# Patient Record
Sex: Male | Born: 1965 | Race: Black or African American | Hispanic: No | Smoking: Former smoker
Health system: Southern US, Community
[De-identification: ages and names within clinical notes are randomized; demographics above are authoritative.]

## PROBLEM LIST (undated history)

## (undated) DIAGNOSIS — N185 Chronic kidney disease, stage 5: Secondary | ICD-10-CM

## (undated) DIAGNOSIS — G509 Disorder of trigeminal nerve, unspecified: Secondary | ICD-10-CM

## (undated) DIAGNOSIS — G4710 Hypersomnia, unspecified: Secondary | ICD-10-CM

## (undated) DIAGNOSIS — R011 Cardiac murmur, unspecified: Secondary | ICD-10-CM

## (undated) DIAGNOSIS — I8390 Asymptomatic varicose veins of unspecified lower extremity: Secondary | ICD-10-CM

## (undated) DIAGNOSIS — I10 Essential (primary) hypertension: Secondary | ICD-10-CM

## (undated) DIAGNOSIS — J189 Pneumonia, unspecified organism: Secondary | ICD-10-CM

## (undated) DIAGNOSIS — J302 Other seasonal allergic rhinitis: Secondary | ICD-10-CM

## (undated) DIAGNOSIS — D869 Sarcoidosis, unspecified: Secondary | ICD-10-CM

## (undated) DIAGNOSIS — G4733 Obstructive sleep apnea (adult) (pediatric): Principal | ICD-10-CM

## (undated) DIAGNOSIS — E785 Hyperlipidemia, unspecified: Secondary | ICD-10-CM

## (undated) HISTORY — PX: KNEE ARTHROSCOPY: SHX127

## (undated) HISTORY — DX: Sarcoidosis, unspecified: D86.9

## (undated) HISTORY — DX: Essential (primary) hypertension: I10

## (undated) HISTORY — DX: Obstructive sleep apnea (adult) (pediatric): G47.33

## (undated) HISTORY — DX: Hyperlipidemia, unspecified: E78.5

## (undated) HISTORY — DX: Hypersomnia, unspecified: G47.10

## (undated) HISTORY — DX: Asymptomatic varicose veins of unspecified lower extremity: I83.90

## (undated) HISTORY — PX: LIVER BIOPSY: SHX301

## (undated) HISTORY — PX: INGUINAL HERNIA REPAIR: SUR1180

## (undated) HISTORY — DX: Disorder of trigeminal nerve, unspecified: G50.9

## (undated) HISTORY — PX: EYE MUSCLE SURGERY: SHX370

---

## 2012-04-19 ENCOUNTER — Ambulatory Visit: Payer: Self-pay | Admitting: Internal Medicine

## 2012-04-23 ENCOUNTER — Encounter: Payer: Self-pay | Admitting: Internal Medicine

## 2012-04-23 DIAGNOSIS — Z0001 Encounter for general adult medical examination with abnormal findings: Secondary | ICD-10-CM | POA: Insufficient documentation

## 2012-04-29 ENCOUNTER — Other Ambulatory Visit (INDEPENDENT_AMBULATORY_CARE_PROVIDER_SITE_OTHER): Payer: 59

## 2012-04-29 ENCOUNTER — Ambulatory Visit (INDEPENDENT_AMBULATORY_CARE_PROVIDER_SITE_OTHER): Payer: 59 | Admitting: Internal Medicine

## 2012-04-29 ENCOUNTER — Encounter: Payer: Self-pay | Admitting: Internal Medicine

## 2012-04-29 VITALS — BP 130/70 | HR 82 | Temp 97.8°F | Ht 77.0 in | Wt 305.0 lb

## 2012-04-29 DIAGNOSIS — G4710 Hypersomnia, unspecified: Secondary | ICD-10-CM

## 2012-04-29 DIAGNOSIS — Z0000 Encounter for general adult medical examination without abnormal findings: Secondary | ICD-10-CM

## 2012-04-29 DIAGNOSIS — D869 Sarcoidosis, unspecified: Secondary | ICD-10-CM

## 2012-04-29 DIAGNOSIS — T7840XA Allergy, unspecified, initial encounter: Secondary | ICD-10-CM | POA: Insufficient documentation

## 2012-04-29 DIAGNOSIS — G4733 Obstructive sleep apnea (adult) (pediatric): Secondary | ICD-10-CM | POA: Insufficient documentation

## 2012-04-29 DIAGNOSIS — I10 Essential (primary) hypertension: Secondary | ICD-10-CM

## 2012-04-29 DIAGNOSIS — I8390 Asymptomatic varicose veins of unspecified lower extremity: Secondary | ICD-10-CM

## 2012-04-29 DIAGNOSIS — E785 Hyperlipidemia, unspecified: Secondary | ICD-10-CM

## 2012-04-29 DIAGNOSIS — G509 Disorder of trigeminal nerve, unspecified: Secondary | ICD-10-CM | POA: Insufficient documentation

## 2012-04-29 HISTORY — DX: Hypersomnia, unspecified: G47.10

## 2012-04-29 HISTORY — DX: Obstructive sleep apnea (adult) (pediatric): G47.33

## 2012-04-29 HISTORY — DX: Asymptomatic varicose veins of unspecified lower extremity: I83.90

## 2012-04-29 LAB — HEPATIC FUNCTION PANEL
ALT: 32 U/L (ref 0–53)
AST: 23 U/L (ref 0–37)
Alkaline Phosphatase: 80 U/L (ref 39–117)
Bilirubin, Direct: 0 mg/dL (ref 0.0–0.3)
Total Protein: 6.8 g/dL (ref 6.0–8.3)

## 2012-04-29 LAB — LIPID PANEL: Total CHOL/HDL Ratio: 5

## 2012-04-29 LAB — URINALYSIS, ROUTINE W REFLEX MICROSCOPIC
Bilirubin Urine: NEGATIVE
Nitrite: NEGATIVE
Urine Glucose: NEGATIVE
Urobilinogen, UA: 0.2 (ref 0.0–1.0)

## 2012-04-29 LAB — CBC WITH DIFFERENTIAL/PLATELET
Basophils Absolute: 0 10*3/uL (ref 0.0–0.1)
Eosinophils Absolute: 0.2 10*3/uL (ref 0.0–0.7)
Lymphocytes Relative: 42 % (ref 12.0–46.0)
MCHC: 33.6 g/dL (ref 30.0–36.0)
MCV: 87.7 fl (ref 78.0–100.0)
Monocytes Absolute: 0.3 10*3/uL (ref 0.1–1.0)
Neutrophils Relative %: 48.5 % (ref 43.0–77.0)
RDW: 13.4 % (ref 11.5–14.6)

## 2012-04-29 LAB — BASIC METABOLIC PANEL
CO2: 30 mEq/L (ref 19–32)
Chloride: 106 mEq/L (ref 96–112)
Glucose, Bld: 96 mg/dL (ref 70–99)
Potassium: 4.9 mEq/L (ref 3.5–5.1)
Sodium: 143 mEq/L (ref 135–145)

## 2012-04-29 LAB — LDL CHOLESTEROL, DIRECT: Direct LDL: 177.1 mg/dL

## 2012-04-29 LAB — PSA: PSA: 1.08 ng/mL (ref 0.10–4.00)

## 2012-04-29 MED ORDER — ASPIRIN 81 MG PO TBEC: 81.0000 mg | DELAYED_RELEASE_TABLET | Freq: Every day | ORAL | Status: DC

## 2012-04-29 MED ORDER — ATORVASTATIN CALCIUM 20 MG PO TABS: 20.0000 mg | ORAL_TABLET | Freq: Every day | ORAL | Status: DC

## 2012-04-29 MED ORDER — OLMESARTAN-AMLODIPINE-HCTZ 20-5-12.5 MG PO TABS: 1.0000 | ORAL_TABLET | Freq: Every day | ORAL | Status: DC

## 2012-04-29 NOTE — Assessment & Plan Note (Signed)
With ? Leg cramp with the simvastatin; ok to change to generic lipitor 20 qd,  to f/u any worsening symptoms or concerns

## 2012-04-29 NOTE — Patient Instructions (Addendum)
Take all new medications as prescribed - the generic lipitor 20 mg per day Continue all other medications as before - the Blood Pressure medicine Please also start Aspirin 81 mg -1 per day - COATED only, to reduce risk of phlebitis, stroke and heart disease You will be contacted regarding the referral for: pulmonary for the sarcoid and possible sleep apnea Please go to LAB in the Basement for the blood and/or urine tests to be done today We will need to let you know if you need to re-start the potassium supplement You will be contacted by phone if any changes need to be made immediately.  Otherwise, you will receive a letter about your results with an explanation. Please return in 1 year for your yearly visit, or sooner if needed, with Lab testing done 3-5 days before

## 2012-04-29 NOTE — Progress Notes (Signed)
Subjective:    Patient ID: Devon [name], male    DOB: 25-May-1966, 46 y.o.   MRN: [id number]  HPI  Here for wellness and to establish as new pt - just moved from [place], works for [employer];  Overall doing ok;  Pt denies CP, worsening SOB, DOE, wheezing, orthopnea, PND, worsening LE edema, palpitations, dizziness or syncope.  Pt denies neurological change such as new Headache, facial or extremity weakness.  Pt denies polydipsia, polyuria, or low sugar symptoms. Pt states overall good compliance with treatment and medications, good tolerability, and trying to follow lower cholesterol diet.  Pt denies worsening depressive symptoms, suicidal ideation or panic. No fever, wt loss, night sweats, loss of appetite, or other constitutional symptoms.  Pt states good ability with ADL's, low fall risk, home safety reviewed and adequate, no significant changes in hearing or vision, and occasionally active with exercise.  Does have singificant snoring and stops breathing at night per wife.  Does have non refreshing sleep, could fall asleep anytime. Has mild LE varicsoties, no hx DVT or phlebitis.  Played 5 yrs Pro Football with [employer], no CNS or significant head trauma he is aware.  Does have occasional leg cramps on the simvastatin - asks for trial change to different statin. Has not taken asa regularly to date Past Medical History  Diagnosis Date  . Sarcoidosis   . Allergy   . Hyperlipidemia   . Hypertension   . 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 No pertinent past surgical history.  reports that he has quit smoking. He has never used smokeless tobacco. He reports that he drinks alcohol. He reports that he does not use illicit drugs. family history includes Cancer in his father and Hypothyroidism in his mother. No Known Allergies Current Outpatient Prescriptions on File Prior to Visit  Medication Sig Dispense Refill  . Olmesartan-Amlodipine-HCTZ (TRIBENZOR)  20-5-12.5 MG TABS Take by mouth daily.      . simvastatin (ZOCOR) 20 MG tablet Take 20 mg by mouth daily.       Review of Systems Review of Systems  Constitutional: Negative for diaphoresis, activity change, appetite change and unexpected weight change.  HENT: Negative for hearing loss, ear pain, facial swelling, mouth sores and neck stiffness.   Eyes: Negative for pain, redness and visual disturbance.  Respiratory: Negative for shortness of breath and wheezing.   Cardiovascular: Negative for chest pain and palpitations.  Gastrointestinal: Negative for diarrhea, blood in stool, abdominal distention and rectal pain.  Genitourinary: Negative for hematuria, flank pain and decreased urine volume.  Musculoskeletal: Negative for myalgias and joint swelling.  Skin: Negative for color change and wound.  Neurological: Negative for syncope and numbness.  Hematological: Negative for adenopathy.  Psychiatric/Behavioral: Negative for hallucinations, self-injury, decreased concentration and agitation.      Objective:   Physical Exam BP 130/70  Pulse 82  Temp 97.8 F (36.6 C) (Oral)  Ht 6\' 5"  (1.956 m)  Wt 305 lb (138.347 kg)  BMI 36.17 kg/m2  SpO2 95% Physical Exam  VS noted Constitutional: Pt appears well-developed and well-nourished.  HENT: Head: Normocephalic.  Right Ear: External ear normal.  Left Ear: External ear normal.  Eyes: Conjunctivae and EOM are normal. Pupils are equal, round, and reactive to light.  Neck: Normal range of motion. Neck supple.  Cardiovascular: Normal rate and regular rhythm.   Pulmonary/Chest: Effort normal and breath sounds normal.  Abd:  Soft, NT, non-distended, +  BS Neurological: Pt is alert. No cranial nerve deficit  Motor/sens/dtr intact.  Bilat knees with mild deg changes, no effusion Skin: Skin is warm. No erythema.  Psychiatric: Pt behavior is normal. Thought content normal. not nervous or depressed affect    Assessment & Plan:

## 2012-04-29 NOTE — Assessment & Plan Note (Signed)
stable overall by hx and exam, most recent data reviewed with pt, and pt to continue medical treatment as before BP Readings from Last 3 Encounters:  04/29/12 130/70  has been on K supplement in the past; will check with labs today

## 2012-04-29 NOTE — Assessment & Plan Note (Signed)
Overall doing well, age appropriate education and counseling updated, referrals for preventative services and immunizations addressed, dietary and smoking counseling addressed, most recent labs and ECG reviewed.  I have personally reviewed and have noted: 1) the patient's medical and social history 2) The pt's use of alcohol, tobacco, and illicit drugs 3) The patient's current medications and supplements 4) Functional ability including ADL's, fall risk, home safety risk, hearing and visual impairment 5) Diet and physical activities 6) Evidence for depression or mood disorder 7) The patient's height, weight, and BMI have been recorded in the chart I have made referrals, and provided counseling and education based on review of the above For labs today, o/w up to date.  To start Asa 81 mg per day - prophylactic

## 2012-04-29 NOTE — Assessment & Plan Note (Signed)
Will need f/u - refer pulm

## 2012-04-29 NOTE — Assessment & Plan Note (Signed)
Relatively high suspicion for osa - refer pulm

## 2012-05-03 ENCOUNTER — Telehealth: Payer: Self-pay | Admitting: *Deleted

## 2012-05-03 NOTE — Telephone Encounter (Signed)
Pt's spouse called regarding question about Tribenzor rx-left message for pt's spouse to callback office.

## 2012-05-04 NOTE — Telephone Encounter (Signed)
Pt is asking for alternative for Tribenzor rx due to cost.

## 2012-05-05 ENCOUNTER — Telehealth: Payer: Self-pay

## 2012-05-05 MED ORDER — AMLODIPINE BESYLATE 5 MG PO TABS: 5.0000 mg | ORAL_TABLET | Freq: Every day | ORAL | Status: DC

## 2012-05-05 MED ORDER — VALSARTAN-HYDROCHLOROTHIAZIDE 160-12.5 MG PO TABS: 1.0000 | ORAL_TABLET | Freq: Every day | ORAL | Status: DC

## 2012-05-05 NOTE — Telephone Encounter (Signed)
Patients wife informed of change and all information.

## 2012-05-05 NOTE — Telephone Encounter (Signed)
Ok to change tribenzor to diovan hct 160/12.5 AND amlod 5 qd - done per emr  (both are generic)  Please let pt know, if for some reason the diovan HCT is not covered as generic, we could consider change to generic avalide which may otherwise be covered

## 2012-05-05 NOTE — Telephone Encounter (Signed)
Called left message to call back 

## 2012-05-05 NOTE — Telephone Encounter (Signed)
Pt's spouse called stating that tribenzor is too expensive. Pt is requesting separate generic components of medication, please advise.

## 2012-06-01 ENCOUNTER — Ambulatory Visit (INDEPENDENT_AMBULATORY_CARE_PROVIDER_SITE_OTHER): Payer: 59 | Admitting: Pulmonary Disease

## 2012-06-01 ENCOUNTER — Encounter: Payer: Self-pay | Admitting: Pulmonary Disease

## 2012-06-01 VITALS — BP 118/78 | HR 71 | Temp 97.8°F | Ht 77.0 in | Wt 304.6 lb

## 2012-06-01 DIAGNOSIS — G4710 Hypersomnia, unspecified: Secondary | ICD-10-CM

## 2012-06-01 NOTE — Patient Instructions (Signed)
Will arrange for sleep study Will call to arrange follow up after sleep study reviewed 

## 2012-06-01 NOTE — Progress Notes (Signed)
Chief Complaint  Patient presents with  . sleep Consult    Per spouse he stops breathing at night, snores and feels tired during the day    History of Present Illness: Devon [name] is a 46 y.o. male for evaluation of sleep apnea.  He has been told by his wife that he snores, and stops breathing while asleep.  This has been getting worse.  She now has to sleep in separate room.  He feels sluggish and tired in the morning.  He denies headaches.  He goes to bed at 11 pm.  He falls asleep quickly.  He wakes up several times to use the bathroom.  He gets out of bed at 5 am.  He is not using anything to help him sleep or stay awake.  He does occasional talk in his sleep.  He will take a nap during his lunch break when he can.  He would do this more often if he had the time.  Epworth score is 14 out of 24.  The patient denies sleep walking, bruxism, or nightmares.  There is no history of restless legs.  The patient denies sleep hallucinations, sleep paralysis, or cataplexy.   Past Medical History  Diagnosis Date  . Sarcoidosis   . Allergy   . Hyperlipidemia   . Hypertension   . 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 Hypersomnolence 04/29/2012  . 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 Hernia repair   . Corrective left eye surgery   . Liver biopsy   . Lung biopsy   . Renal biopsy     Current Outpatient Prescriptions on File Prior to Visit  Medication Sig Dispense Refill  . amLODipine (NORVASC) 5 MG tablet Take 1 tablet (5 mg total) by mouth daily.  90 tablet  3  . aspirin 81 MG EC tablet Take 1 tablet (81 mg total) by mouth daily. Swallow whole.  30 tablet  12  . atorvastatin (LIPITOR) 20 MG tablet Take 1 tablet (20 mg total) by mouth daily.  90 tablet  3  . valsartan-hydrochlorothiazide (DIOVAN HCT) 160-12.5 MG per tablet Take 1 tablet by mouth daily.  90 tablet  3    No Known Allergies  Family History    Problem Relation Age of Onset  . Hypothyroidism Mother   . Cancer Father     History  Substance Use Topics  . Smoking status: Former Smoker -- 0.5 packs/day for 2 years    Types: Cigarettes    Quit date: 11/18/2007  . Smokeless tobacco: Never Used  . Alcohol Use: Yes     occasional social    Review of Systems  Constitutional: Negative for fever, appetite change and unexpected weight change.  HENT: Negative for ear pain, congestion, sore throat, sneezing, trouble swallowing and dental problem.   Respiratory: Negative for cough and shortness of breath.   Cardiovascular: Negative for chest pain, palpitations and leg swelling.  Gastrointestinal: Negative for abdominal pain.  Musculoskeletal: Negative for joint swelling.  Skin: Negative for rash.  Neurological: Negative for headaches.  Psychiatric/Behavioral: Negative for dysphoric mood. The patient is not nervous/anxious.    Physical Exam: Filed Vitals:   06/01/12 1143 06/01/12 1144  BP:  118/78  Pulse:  71  Temp: 97.8 F (36.6 C)   TempSrc: Oral   Height: 6\' 5"  (1.956 m)   Weight: 304 lb 9.6 oz (  138.166 kg)   SpO2:  95%  ,  Current Encounter SPO2  06/01/12 1144 95%  04/29/12 0945 95%    Wt Readings from Last 3 Encounters:  06/01/12 304 lb 9.6 oz (138.166 kg)  04/29/12 305 lb (138.347 kg)    Body mass index is 36.12 kg/(m^2).   General - No distress ENT - TM clear, no sinus tenderness, no oral exudate, no LAN, no thyromegaly, 3+ tonsils, scalloped tongue border Cardiac - s1s2 regular, no murmur, pulses symmetric, no edema Chest - normal respiratory excursion, good air entry, no wheeze/rales/dullness Back - no focal tenderness Abd - soft, non-tender, no organomegaly, + bowel sounds Ext - normal motor strength Neuro - Cranial nerves are normal. PERLA. EOM's intact. Skin - no discernible active dermatitis, erythema, urticaria or inflammatory process. Psych - normal mood, and behavior.   Patient was never  admitted.  Lab Results  Component Value Date   WBC 5.9 04/29/2012   HGB 13.6 04/29/2012   HCT 40.4 04/29/2012   MCV 87.7 04/29/2012   PLT 256.0 04/29/2012    Lab Results  Component Value Date   CREATININE 1.7* 04/29/2012   BUN 20 04/29/2012   NA 143 04/29/2012   K 4.9 04/29/2012   CL 106 04/29/2012   CO2 30 04/29/2012    Lab Results  Component Value Date   ALT 32 04/29/2012   AST 23 04/29/2012   ALKPHOS 80 04/29/2012   BILITOT 0.7 04/29/2012    Lab Results  Component Value Date   TSH 0.64 04/29/2012    BNP No results found for this basename: probnp      Assessment/Plan:  Coralyn Helling, MD [place] [facility]/Sleep Pager:  [phone] 06/01/2012, 12:37 PM  Patient Instructions  Will arrange for sleep study Will call to arrange follow up after sleep study reviewed

## 2012-06-01 NOTE — Progress Notes (Deleted)
  Subjective:    Patient ID: Devon [name], male    DOB: [birth year], 46 y.o.   MRN: [id number]  HPI    Review of Systems  Constitutional: Negative for fever, appetite change and unexpected weight change.  HENT: Negative for ear pain, congestion, sore throat, sneezing, trouble swallowing and dental problem.   Respiratory: Negative for cough and shortness of breath.   Cardiovascular: Negative for chest pain, palpitations and leg swelling.  Gastrointestinal: Negative for abdominal pain.  Musculoskeletal: Negative for joint swelling.  Skin: Negative for rash.  Neurological: Negative for headaches.  Psychiatric/Behavioral: Negative for dysphoric mood. The patient is not nervous/anxious.        Objective:   Physical Exam        Assessment & Plan:

## 2012-06-01 NOTE — Assessment & Plan Note (Signed)
He has snoring, sleep disruption, daytime sleepiness, and witnessed apnea.  He has refractory hypertension.  I am concerned he could have sleep apnea.  I have explained how sleep apnea can affect the patient's health.  Driving precautions and importance of weight loss were discussed.  Treatment options for sleep apnea were reviewed.  To further assess will arrange for in lab sleep study.

## 2012-06-02 ENCOUNTER — Encounter (HOSPITAL_BASED_OUTPATIENT_CLINIC_OR_DEPARTMENT_OTHER): Payer: 59

## 2012-06-07 ENCOUNTER — Ambulatory Visit (HOSPITAL_BASED_OUTPATIENT_CLINIC_OR_DEPARTMENT_OTHER): Payer: 59 | Attending: Pulmonary Disease

## 2012-06-07 VITALS — Ht 77.0 in | Wt 304.0 lb

## 2012-06-07 DIAGNOSIS — G4710 Hypersomnia, unspecified: Secondary | ICD-10-CM

## 2012-06-07 DIAGNOSIS — G4733 Obstructive sleep apnea (adult) (pediatric): Secondary | ICD-10-CM | POA: Insufficient documentation

## 2012-06-08 ENCOUNTER — Telehealth: Payer: Self-pay | Admitting: Pulmonary Disease

## 2012-06-08 ENCOUNTER — Encounter: Payer: Self-pay | Admitting: Pulmonary Disease

## 2012-06-08 DIAGNOSIS — G4733 Obstructive sleep apnea (adult) (pediatric): Secondary | ICD-10-CM

## 2012-06-08 NOTE — Telephone Encounter (Signed)
PSG 06/07/12>>AHI 5.6, SpO2 low 85%, PLMI 0.  REM AHI 18.9, Supine AHI 12.9.  Mild sleep apnea with REM and positional effect.  Will have my nurse schedule ROV to review results.

## 2012-06-09 NOTE — Procedures (Signed)
NAMEJORDEN, [name] NO.:  [id number]  MEDICAL RECORD NO.:  [id number]          PATIENT TYPE:  OUT  LOCATION:  SLEEP CENTER                 FACILITY:  [facility]  PHYSICIAN:  Coralyn Helling, MD        DATE OF BIRTH:  [birth year]  DATE OF STUDY:  05/28/2012                           NOCTURNAL POLYSOMNOGRAM  REFERRING PHYSICIAN:  Coralyn Helling, MD  INDICATION FOR STUDY:  [name] is a 46 year old male who has a history of hypertension.  He also reports symptoms of snoring, sleep disruption, and daytime sleepiness.  He is therefore referred to the sleep lab for evaluation of hypersomnia with obstructive sleep apnea.  Height is 6 feet 5 inches, weight is 304 pounds.  BMI is 36.  Neck size 18.5 inches.  EPWORTH SLEEPINESS SCORE:  18.  MEDICATIONS:  Valsartan, aspirin, atorvastatin, and amlodipine.  SLEEP ARCHITECTURE:  Total recording time was 352 minutes.  Total sleep time was 297 minutes.  Sleep efficiency was 83%.  Sleep latency was 31 minutes.  REM latency was 36 minutes.  The study was notable for the lack of slow-wave sleep and he slept predominantly in the non-supine position.  RESPIRATORY DATA:  The average respiratory rate was 16.  Moderate snoring was noted by the technician.  The overall apnea-hypopnea index was 5.6.  There were 10 central apneic events.  The remainder of the events were obstructive in nature.  The REM apnea-hypopnea index was 18.9.  The supine apnea-hypopnea index was 12.9.  OXYGEN DATA:  The baseline oxygenation was 96%.  The oxygen saturation nadir was 85%.  The patient spent a total of 0.6 minutes with an oxygen saturation below 88%.  The study was conducted without the use of supplemental oxygen.  CARDIAC DATA:  The average heart rate was 60 and the rhythm strip showed sinus rhythm with occasional PVCs.  MOVEMENT-PARASOMNIA:  The periodic limb movement index was 0 and the patient had 1 restroom trip.  IMPRESSIONS-RECOMMENDATIONS:   This study shows evidence for mild obstructive sleep apnea with an apnea-hypopnea index of 5.6 and oxygen saturation nadir of 85%.  He had a significant REM and positional effect to his sleep-disordered breathing.  In addition to diet, exercise, and weight reduction, additional therapeutic interventions could include positional therapy, CPAP therapy, oral appliance, or surgical intervention.     Coralyn Helling, MD Diplomat, American Board of Sleep Medicine    VS/MEDQ  D:  06/08/2012 11:53:08  T:  06/09/2012 01:35:23  Job:  [id number]

## 2012-06-09 NOTE — Telephone Encounter (Signed)
[id number] x1 

## 2012-06-09 NOTE — Telephone Encounter (Signed)
Pt is scheduled to come in tomorrow 06/10/12 at 3:15 since pt is off work

## 2012-06-09 NOTE — Telephone Encounter (Signed)
Pt returned call. Kathleen W Perdue  

## 2012-06-10 ENCOUNTER — Ambulatory Visit (INDEPENDENT_AMBULATORY_CARE_PROVIDER_SITE_OTHER): Payer: 59 | Admitting: Pulmonary Disease

## 2012-06-10 ENCOUNTER — Encounter: Payer: Self-pay | Admitting: Pulmonary Disease

## 2012-06-10 VITALS — BP 120/82 | HR 80 | Temp 99.0°F | Ht 77.0 in | Wt 304.0 lb

## 2012-06-10 DIAGNOSIS — G4733 Obstructive sleep apnea (adult) (pediatric): Secondary | ICD-10-CM

## 2012-06-10 NOTE — Progress Notes (Signed)
Chief Complaint  Patient presents with  . Follow-up    Pt here to discuss sleep study results.    History of Present Illness: Devon [name] is a 46 y.o. male with mild sleep apnea.  He is here to review PSG from 06/07/12>>AHI 5.6, SpO2 low 85%, PLMI 0. REM AHI 18.9, Supine AHI 12.9.    Past Medical History  Diagnosis Date  . Sarcoidosis   . Allergy   . Hyperlipidemia   . Hypertension   . 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 Hypersomnolence 04/29/2012  . 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 OSA (obstructive sleep apnea) 04/29/2012    Past Surgical History  Procedure Date  . Hernia repair   . Corrective left eye surgery   . Liver biopsy   . Lung biopsy   . Renal biopsy     Outpatient Encounter Prescriptions as of 06/10/2012  Medication Sig Dispense Refill  . amLODipine (NORVASC) 5 MG tablet Take 1 tablet (5 mg total) by mouth daily.  90 tablet  3  . aspirin 81 MG EC tablet Take 1 tablet (81 mg total) by mouth daily. Swallow whole.  30 tablet  12  . atorvastatin (LIPITOR) 20 MG tablet Take 1 tablet (20 mg total) by mouth daily.  90 tablet  3  . valsartan-hydrochlorothiazide (DIOVAN HCT) 160-12.5 MG per tablet Take 1 tablet by mouth daily.  90 tablet  3    No Known Allergies  Physical Exam:  Blood pressure 120/82, pulse 80, temperature 99 F (37.2 C), temperature source Oral, height 6\' 5"  (1.956 m), weight 304 lb (137.893 kg), SpO2 94.00%.  Body mass index is 36.05 kg/(m^2). Wt Readings from Last 2 Encounters:  06/10/12 304 lb (137.893 kg)  06/07/12 304 lb (137.893 kg)    General - No distress  ENT - no sinus tenderness, no oral exudate, no LAN, no thyromegaly, 3+ tonsils, scalloped tongue border  Cardiac - s1s2 regular, no murmur, pulses symmetric, no edema  Chest - normal respiratory excursion, good air entry, no wheeze/rales/dullness   Abd - soft, non-tender, no organomegaly, + bowel sounds  Ext - normal motor strength  Neuro  - Cranial nerves are normal. PERLA. EOM's intact.  Skin - no discernible active dermatitis, erythema, urticaria or inflammatory process.  Psych - normal mood, and behavior.    Assessment/Plan:  Coralyn Helling, MD [place] [facility]/Sleep Pager:  [phone] 06/10/2012, 3:23 PM

## 2012-06-10 NOTE — Assessment & Plan Note (Signed)
He has mild sleep apnea with positional and REM effect.  He has refractory hypertension.  I have reviewed his sleep test results with the patient.  Explained how sleep apnea can affect the patient's health.  Driving precautions and importance of weight loss were discussed.  Treatment options for sleep apnea were reviewed.  Will arrange for auto CPAP set up at home.  Reviewed mask fit and techniques to adjust to CPAP set up.

## 2012-06-10 NOTE — Patient Instructions (Signed)
Will arrange for CPAP set up Follow up in 2 months 

## 2012-06-17 ENCOUNTER — Encounter (HOSPITAL_BASED_OUTPATIENT_CLINIC_OR_DEPARTMENT_OTHER): Payer: 59

## 2012-07-08 ENCOUNTER — Telehealth: Payer: Self-pay | Admitting: Pulmonary Disease

## 2012-07-08 DIAGNOSIS — G4733 Obstructive sleep apnea (adult) (pediatric): Secondary | ICD-10-CM

## 2012-07-08 NOTE — Telephone Encounter (Signed)
I spoke with patient about results and he verbalized understanding and had no questions 

## 2012-07-08 NOTE — Telephone Encounter (Signed)
Auto CPAP 06/19/12 to 07/04/12>>Used on 16 of 16 nights with average 7 hrs 18 min.  Average AHI 0.8 with median CPAP 9 cm H2O and 95th percentile CPAP 12 cm H2O.  Will have my nurse inform patient that CPAP report looked great.  Will continue current CPAP set up, and discuss in more detail at next visit.

## 2012-08-11 ENCOUNTER — Ambulatory Visit: Payer: 59 | Admitting: Pulmonary Disease

## 2012-08-13 ENCOUNTER — Ambulatory Visit: Payer: 59 | Admitting: Pulmonary Disease

## 2013-05-27 ENCOUNTER — Other Ambulatory Visit: Payer: Self-pay | Admitting: Internal Medicine

## 2013-06-02 ENCOUNTER — Ambulatory Visit: Payer: 59 | Admitting: Internal Medicine

## 2013-06-06 ENCOUNTER — Ambulatory Visit: Payer: 59 | Admitting: Internal Medicine

## 2013-06-06 ENCOUNTER — Telehealth: Payer: Self-pay | Admitting: *Deleted

## 2013-06-06 NOTE — Telephone Encounter (Signed)
Confidential Office Message [address] [place], [place] [address] p. [phone] f. [phone] [facility] Schanz Fax: [phone] From: [facility] Date/ Time: 06/05/2013 11:47 AM Taken By: Katherine Mantle, CSR Caller: Dyllan Facility: not collected Patient: Devon [name] DOB: [birth year] Phone: [phone] Reason for Call: See info below Regarding Appointment: Yes Appt Date: 06/05/2013 Appt Time: 2:15:00 AM Provider: Oliver Barre (Adults only) Reason: Details: Outcome: Cancelled appointment in EPIC [facility])

## 2015-06-30 ENCOUNTER — Encounter (HOSPITAL_COMMUNITY): Payer: Self-pay | Admitting: Emergency Medicine

## 2015-06-30 ENCOUNTER — Emergency Department (INDEPENDENT_AMBULATORY_CARE_PROVIDER_SITE_OTHER)
Admission: EM | Admit: 2015-06-30 | Discharge: 2015-06-30 | Disposition: A | Discharge status: Home / Self Care | Payer: Self-pay | Source: Home / Self Care

## 2015-06-30 DIAGNOSIS — R51 Headache: Secondary | ICD-10-CM

## 2015-06-30 DIAGNOSIS — I10 Essential (primary) hypertension: Secondary | ICD-10-CM

## 2015-06-30 DIAGNOSIS — E785 Hyperlipidemia, unspecified: Secondary | ICD-10-CM

## 2015-06-30 DIAGNOSIS — R519 Headache, unspecified: Secondary | ICD-10-CM

## 2015-06-30 MED ORDER — HYDROCHLOROTHIAZIDE 25 MG PO TABS: 25.0000 mg | ORAL_TABLET | Freq: Every day | ORAL | Status: DC

## 2015-06-30 MED ORDER — AMLODIPINE BESYLATE 5 MG PO TABS: 5.0000 mg | ORAL_TABLET | Freq: Every day | ORAL | Status: DC

## 2015-06-30 MED ORDER — ATORVASTATIN CALCIUM 20 MG PO TABS: 20.0000 mg | ORAL_TABLET | Freq: Every day | ORAL | Status: DC

## 2015-06-30 NOTE — ED Provider Notes (Signed)
CSN: [id number]     Arrival date & time 06/30/15  1743 History   None    Chief Complaint  Patient presents with  . Medication Refill   (Consider location/radiation/quality/duration/timing/severity/associated sxs/prior Treatment) Patient is a 49 y.o. male presenting with hypertension and headaches.  Hypertension This is a chronic problem. The problem occurs constantly. The problem has not changed (He has been out of his meds for 5 days. he is on water pills and other medications.) since onset.Associated symptoms include headaches. Pertinent negatives include no chest pain and no shortness of breath. Nothing aggravates the symptoms.  Headache Pain location:  Frontal Radiates to:  Does not radiate Severity currently:  3/10 Severity at highest:  5/10 Onset quality:  Gradual Timing:  Intermittent Progression:  Unchanged Similar to prior headaches: yes (Associated with his high BP)   Context comment:  Not taking his BP meds Worsened by:  Nothing Associated symptoms: no blurred vision and no fever   HLD: Need med refill.  Past Medical History  Diagnosis Date  . Sarcoidosis   . Allergy   . Hyperlipidemia   . Hypertension   . 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 Hypersomnolence 04/29/2012  . 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 OSA (obstructive sleep apnea) 04/29/2012   Past Surgical History  Procedure Laterality Date  . Hernia repair    . Corrective left eye surgery    . Liver biopsy    . Lung biopsy    . Renal biopsy     Family History  Problem Relation Age of Onset  . Hypothyroidism Mother   . Cancer Father    Social History  Substance Use Topics  . Smoking status: Former Smoker -- 0.50 packs/day for 2 years    Types: Cigarettes    Quit date: 11/18/2007  . Smokeless tobacco: Never Used  . Alcohol Use: Yes     Comment: occasional social    Review of Systems  Constitutional: Negative for fever.  Eyes: Negative for blurred vision.   Respiratory: Negative.  Negative for shortness of breath.   Cardiovascular: Negative.  Negative for chest pain.  Gastrointestinal: Negative.   Neurological: Positive for headaches.    Allergies  Review of patient's allergies indicates no known allergies.  Home Medications   Prior to Admission medications   Medication Sig Start Date End Date Taking? Authorizing Provider  amLODipine (NORVASC) 5 MG tablet TAKE 1 TABLET BY MOUTH EVERY DAY 05/27/13  Yes Corwin Levins, MD  aspirin 81 MG EC tablet TAKE 1 TABLET BY MOUTH EVERY DAY 05/27/13  Yes Corwin Levins, MD  atorvastatin (LIPITOR) 20 MG tablet TAKE ONE TABLET BY MOUTH DAILY 05/27/13  Yes Corwin Levins, MD  hydrochlorothiazide (HYDRODIURIL) 25 MG tablet Take 25 mg by mouth daily.   Yes Historical Provider, MD  valsartan-hydrochlorothiazide (DIOVAN-HCT) 160-12.5 MG per tablet TAKE 1 TABLET BY MOUTH EVERY DAY 05/27/13   Corwin Levins, MD   BP 178/103 mmHg  Pulse 77  Temp(Src) 98.8 F (37.1 C) (Oral)  Resp 16  SpO2 100% Physical Exam  Constitutional: He is oriented to person, place, and time.  Cardiovascular: Normal rate, regular rhythm and normal heart sounds.   No murmur heard. Pulmonary/Chest: Effort normal and breath sounds normal. No respiratory distress. He has no wheezes.  Abdominal: Soft. Bowel sounds are normal. He exhibits no distension. There is no tenderness.  Musculoskeletal: He exhibits edema.  Neurological:  He is alert and oriented to person, place, and time. He has normal reflexes. No cranial nerve deficit.    ED Course  Procedures (including critical care time) Labs Review Labs Reviewed - No data to display  Imaging Review No results found.   MDM  No diagnosis found. HTN HLD Headache  BP meds reviewed. His most recent meds include HCTZ, he had been on DIOVAN HCTZ in the past as well as Norvasc. Today I refilled his HCTZ and Norvasc. He is advised to see PCP soon. I refilled his Lipitor. Headache likely due to  his BP. Use Tylenol prn headache. Return precaution discussed.    Doreene Eland, MD 06/30/15 [phone]

## 2015-06-30 NOTE — ED Notes (Signed)
C/o medication refill States he is out of meds since Tuesday States he has a headache

## 2015-06-30 NOTE — Discharge Instructions (Signed)

## 2015-07-19 HISTORY — PX: RENAL BIOPSY: SHX156

## 2015-07-19 HISTORY — PX: LUNG BIOPSY: SHX232

## 2015-08-01 ENCOUNTER — Ambulatory Visit (INDEPENDENT_AMBULATORY_CARE_PROVIDER_SITE_OTHER): Payer: Self-pay | Admitting: Internal Medicine

## 2015-08-01 ENCOUNTER — Telehealth: Payer: Self-pay | Admitting: *Deleted

## 2015-08-01 ENCOUNTER — Encounter: Payer: Self-pay | Admitting: Internal Medicine

## 2015-08-01 VITALS — BP 182/104 | HR 78 | Temp 97.6°F | Ht 77.0 in | Wt 305.0 lb

## 2015-08-01 DIAGNOSIS — N186 End stage renal disease: Secondary | ICD-10-CM | POA: Insufficient documentation

## 2015-08-01 DIAGNOSIS — N183 Chronic kidney disease, stage 3 (moderate): Secondary | ICD-10-CM

## 2015-08-01 DIAGNOSIS — Z23 Encounter for immunization: Secondary | ICD-10-CM

## 2015-08-01 DIAGNOSIS — E785 Hyperlipidemia, unspecified: Secondary | ICD-10-CM

## 2015-08-01 DIAGNOSIS — N185 Chronic kidney disease, stage 5: Secondary | ICD-10-CM | POA: Insufficient documentation

## 2015-08-01 DIAGNOSIS — I10 Essential (primary) hypertension: Secondary | ICD-10-CM

## 2015-08-01 MED ORDER — HYDRALAZINE HCL 50 MG PO TABS: 50.0000 mg | ORAL_TABLET | Freq: Three times a day (TID) | ORAL | Status: DC

## 2015-08-01 MED ORDER — ATORVASTATIN CALCIUM 20 MG PO TABS: 20.0000 mg | ORAL_TABLET | Freq: Every day | ORAL | Status: DC

## 2015-08-01 MED ORDER — HYDROCHLOROTHIAZIDE 25 MG PO TABS: 25.0000 mg | ORAL_TABLET | Freq: Every day | ORAL | Status: DC

## 2015-08-01 MED ORDER — AMLODIPINE BESYLATE 5 MG PO TABS: 5.0000 mg | ORAL_TABLET | Freq: Every day | ORAL | Status: DC

## 2015-08-01 MED ORDER — AMLODIPINE BESYLATE 10 MG PO TABS: 10.0000 mg | ORAL_TABLET | Freq: Every day | ORAL | Status: DC

## 2015-08-01 NOTE — Patient Instructions (Addendum)
You had the flu shot today  Please take all new medication as prescribed - the hydralazine 50 mg three times per day  OK to increase the amlodipine to 10 mg per day  Please continue all other medications as before, and refills have been done if requested - the fluid pill  Please have the pharmacy call with any other refills you may need.  Please continue your efforts at being more active, low cholesterol diet, and weight control.  Please keep your appointments with your specialists as you may have planned  Please make appt asap for Blood Pressure check and physical

## 2015-08-01 NOTE — Assessment & Plan Note (Signed)
I suspect may have worsened due ot HTN in last 3 yrs,  Lab Results  Component Value Date   CREATININE 1.7* 04/29/2012   Will need f/u lab next visit as soon pt can be arranged for this, but again wants to get job and insurance determined very soon first

## 2015-08-01 NOTE — Progress Notes (Signed)
Pre visit review using our clinic review tool, if applicable. No additional management support is needed unless otherwise documented below in the visit note. 

## 2015-08-01 NOTE — Assessment & Plan Note (Signed)
Severe uncontrolled, to cont hct, but increase amlod 5 qd, and for now add hydralazine 50 tid.  Will need lab work such as bun/cr to consider ACE/ARB, also consider BB to try to avoid in this younger pt for now. To fu BP at home and next visit which he plans to make asap as insurance allows

## 2015-08-01 NOTE — Telephone Encounter (Signed)
Pharmacist states she is needing to clarify which dosage pt should be taking received both 5 mg & 10 mg on Norvasc. Inform pharmacist per chart md increase pt to 10 mg.../lmb

## 2015-08-01 NOTE — Assessment & Plan Note (Signed)
Ok to re-start lipitor, f/u with labs at next visit

## 2015-08-01 NOTE — Progress Notes (Signed)
Subjective:    Patient ID: Devon [name], male    DOB: 1966/10/22, 49 y.o.   MRN: [id number]  HPI  Here to f/u after lost x 3 yrs due to lost insurance; has hx of mild renal insuff, HTN, HLD, last seen started lipitor 20 qd, hct 25, and amlod 5, Has been able to get refills along the way and has been taking meds recently, but more recently seen at [facility] in [facility] where BP elevated, and found renal insuff, though cant recall the name of the UC or numbers.  Still has no insurance today, though hoping to get hired on very soon from his temp position at [employer], wants to keep costs as low as possible today, declines labs. Pt denies chest pain, increased sob or doe, wheezing, orthopnea, PND, increased LE swelling, palpitations, dizziness or syncope.  Pt denies new neurological symptoms such as new headache, or facial or extremity weakness or numbness   Pt denies polydipsia, polyuria,   Pt states overall good compliance with meds, but has not been trying to follow lower cholesterol diet, wt overall stable but little exercise however.  Past Medical History  Diagnosis Date  . Sarcoidosis   . Allergy   . Hyperlipidemia   . Hypertension   . 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 Hypersomnolence 04/29/2012  . 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 OSA (obstructive sleep apnea) 04/29/2012   Past Surgical History  Procedure Laterality Date  . Hernia repair    . Corrective left eye surgery    . Liver biopsy    . Lung biopsy    . Renal biopsy      reports that he quit smoking about 7 years ago. His smoking use included Cigarettes. He has a 1 pack-year smoking history. He has never used smokeless tobacco. He reports that he drinks alcohol. He reports that he does not use illicit drugs. family history includes Cancer in his father; Hypothyroidism in his mother. No Known Allergies Current Outpatient Prescriptions on File Prior to Visit  Medication Sig Dispense Refill  .  aspirin 81 MG EC tablet TAKE 1 TABLET BY MOUTH EVERY DAY 90 tablet 3   No current facility-administered medications on file prior to visit.     Review of Systems  Constitutional: Negative for unusual diaphoresis or night sweats HENT: Negative for ringing in ear or discharge Eyes: Negative for double vision or worsening visual disturbance.  Respiratory: Negative for choking and stridor.   Gastrointestinal: Negative for vomiting or other signifcant bowel change Genitourinary: Negative for hematuria or change in urine volume.  Musculoskeletal: Negative for other MSK pain or swelling Skin: Negative for color change and worsening wound.  Neurological: Negative for tremors and numbness other than noted  Psychiatric/Behavioral: Negative for decreased concentration or agitation other than above       Objective:   Physical Exam BP 182/104 mmHg  Pulse 78  Temp(Src) 97.6 F (36.4 C) (Oral)  Ht  (1.956 m)  Wt 305 lb (138.347 kg)  BMI 36.16 kg/m2  SpO2 97% VS noted,  Not ill appreaing Constitutional: Pt appears in no significant distress HENT: Head: NCAT.  Right Ear: External ear normal.  Left Ear: External ear normal.  Eyes: . Pupils are equal, round, and reactive to light. Conjunctivae and EOM are normal Neck: Normal range of motion. Neck supple.  Cardiovascular: Normal rate and regular rhythm.   Pulmonary/Chest: Effort normal  and breath sounds without rales or wheezing.  Abd:  Soft, NT, ND, + BS Neurological: Pt is alert. Not confused , motor grossly intact Skin: Skin is warm. No rash, trace chronic bilat LE edema Psychiatric: Pt behavior is normal. No agitation.      Assessment & Plan:

## 2015-09-20 ENCOUNTER — Encounter: Payer: Medicaid Other | Admitting: Internal Medicine

## 2015-11-18 HISTORY — PX: BIOPSY THYROID: PRO38

## 2015-12-20 ENCOUNTER — Encounter: Payer: Medicaid Other | Admitting: Internal Medicine

## 2016-02-28 ENCOUNTER — Other Ambulatory Visit: Payer: Self-pay | Admitting: *Deleted

## 2016-02-28 MED ORDER — HYDROCHLOROTHIAZIDE 25 MG PO TABS: 25.0000 mg | ORAL_TABLET | Freq: Every day | ORAL | Status: DC

## 2016-02-28 MED ORDER — ATORVASTATIN CALCIUM 20 MG PO TABS: 20.0000 mg | ORAL_TABLET | Freq: Every day | ORAL | Status: DC

## 2016-04-29 ENCOUNTER — Encounter: Payer: Self-pay | Admitting: Internal Medicine

## 2016-04-29 ENCOUNTER — Other Ambulatory Visit: Payer: Self-pay | Admitting: Internal Medicine

## 2016-04-29 ENCOUNTER — Ambulatory Visit (INDEPENDENT_AMBULATORY_CARE_PROVIDER_SITE_OTHER): Payer: Self-pay | Admitting: Internal Medicine

## 2016-04-29 ENCOUNTER — Other Ambulatory Visit (INDEPENDENT_AMBULATORY_CARE_PROVIDER_SITE_OTHER): Payer: Self-pay

## 2016-04-29 VITALS — BP 140/80 | HR 79 | Temp 98.1°F | Resp 20 | Wt 278.0 lb

## 2016-04-29 DIAGNOSIS — R6889 Other general symptoms and signs: Secondary | ICD-10-CM

## 2016-04-29 DIAGNOSIS — Z0001 Encounter for general adult medical examination with abnormal findings: Secondary | ICD-10-CM

## 2016-04-29 DIAGNOSIS — N183 Chronic kidney disease, stage 3 (moderate): Secondary | ICD-10-CM

## 2016-04-29 DIAGNOSIS — I10 Essential (primary) hypertension: Secondary | ICD-10-CM

## 2016-04-29 DIAGNOSIS — N184 Chronic kidney disease, stage 4 (severe): Secondary | ICD-10-CM

## 2016-04-29 DIAGNOSIS — E785 Hyperlipidemia, unspecified: Secondary | ICD-10-CM

## 2016-04-29 DIAGNOSIS — E042 Nontoxic multinodular goiter: Secondary | ICD-10-CM | POA: Insufficient documentation

## 2016-04-29 LAB — CBC WITH DIFFERENTIAL/PLATELET
BASOS PCT: 0.4 % (ref 0.0–3.0)
Basophils Absolute: 0 10*3/uL (ref 0.0–0.1)
EOS PCT: 4.5 % (ref 0.0–5.0)
Eosinophils Absolute: 0.3 10*3/uL (ref 0.0–0.7)
HEMATOCRIT: 38.2 % — AB (ref 39.0–52.0)
HEMOGLOBIN: 12.8 g/dL — AB (ref 13.0–17.0)
Lymphocytes Relative: 35.4 % (ref 12.0–46.0)
Lymphs Abs: 2.2 10*3/uL (ref 0.7–4.0)
MCHC: 33.5 g/dL (ref 30.0–36.0)
MCV: 85.4 fl (ref 78.0–100.0)
MONO ABS: 0.4 10*3/uL (ref 0.1–1.0)
MONOS PCT: 6.4 % (ref 3.0–12.0)
NEUTROS PCT: 53.3 % (ref 43.0–77.0)
Neutro Abs: 3.4 10*3/uL (ref 1.4–7.7)
Platelets: 273 10*3/uL (ref 150.0–400.0)
RBC: 4.47 Mil/uL (ref 4.22–5.81)
RDW: 14.6 % (ref 11.5–15.5)
WBC: 6.3 10*3/uL (ref 4.0–10.5)

## 2016-04-29 LAB — URINALYSIS, ROUTINE W REFLEX MICROSCOPIC
Bilirubin Urine: NEGATIVE
KETONES UR: NEGATIVE
Leukocytes, UA: NEGATIVE
NITRITE: NEGATIVE
RBC / HPF: NONE SEEN (ref 0–?)
SPECIFIC GRAVITY, URINE: 1.02 (ref 1.000–1.030)
Total Protein, Urine: >=300 — AB
URINE GLUCOSE: NEGATIVE
UROBILINOGEN UA: 0.2 (ref 0.0–1.0)
WBC UA: NONE SEEN (ref 0–?)
pH: 6 (ref 5.0–8.0)

## 2016-04-29 LAB — LIPID PANEL
CHOL/HDL RATIO: 6
Cholesterol: 364 mg/dL — ABNORMAL HIGH (ref 0–200)
HDL: 64.2 mg/dL (ref >39.00)
LDL CALC: 273 mg/dL — AB (ref 0–99)
NonHDL: 299.58
TRIGLYCERIDES: 132 mg/dL (ref 0.0–149.0)
VLDL: 26.4 mg/dL (ref 0.0–40.0)

## 2016-04-29 LAB — HEPATIC FUNCTION PANEL
ALT: 17 U/L (ref 0–53)
AST: 23 U/L (ref 0–37)
Albumin: 3.4 g/dL — ABNORMAL LOW (ref 3.5–5.2)
Alkaline Phosphatase: 64 U/L (ref 39–117)
BILIRUBIN DIRECT: 0.1 mg/dL (ref 0.0–0.3)
BILIRUBIN TOTAL: 0.5 mg/dL (ref 0.2–1.2)
TOTAL PROTEIN: 5.9 g/dL — AB (ref 6.0–8.3)

## 2016-04-29 LAB — BASIC METABOLIC PANEL
BUN: 41 mg/dL — AB (ref 6–23)
CHLORIDE: 105 meq/L (ref 96–112)
CO2: 27 mEq/L (ref 19–32)
Calcium: 9 mg/dL (ref 8.4–10.5)
Creatinine, Ser: 4.3 mg/dL — ABNORMAL HIGH (ref 0.40–1.50)
GFR: 18.95 mL/min — ABNORMAL LOW (ref >60.00)
Glucose, Bld: 86 mg/dL (ref 70–99)
POTASSIUM: 4.6 meq/L (ref 3.5–5.1)
SODIUM: 140 meq/L (ref 135–145)

## 2016-04-29 LAB — PSA: PSA: 1.04 ng/mL (ref 0.10–4.00)

## 2016-04-29 LAB — TSH: TSH: 1 u[IU]/mL (ref 0.35–4.50)

## 2016-04-29 MED ORDER — AMLODIPINE BESYLATE 10 MG PO TABS: 10.0000 mg | ORAL_TABLET | Freq: Every day | ORAL | Status: DC

## 2016-04-29 MED ORDER — HYDRALAZINE HCL 50 MG PO TABS: 50.0000 mg | ORAL_TABLET | Freq: Three times a day (TID) | ORAL | Status: DC

## 2016-04-29 MED ORDER — HYDROCHLOROTHIAZIDE 25 MG PO TABS: 25.0000 mg | ORAL_TABLET | Freq: Every day | ORAL | Status: DC

## 2016-04-29 MED ORDER — ATORVASTATIN CALCIUM 20 MG PO TABS: 20.0000 mg | ORAL_TABLET | Freq: Every day | ORAL | Status: DC

## 2016-04-29 NOTE — Progress Notes (Signed)
Subjective:    Patient ID: Devon [name], male    DOB: October 11, 1966, 50 y.o.   MRN: [id number]  HPI  Here for wellness and re-stablish as new pt;  Overall doing ok;  Pt denies Chest pain, worsening SOB, DOE, wheezing, orthopnea, PND, worsening LE edema, palpitations, dizziness or syncope.  Pt denies neurological change such as new headache, facial or extremity weakness.  Pt denies polydipsia, polyuria, or low sugar symptoms. Pt states overall good compliance with treatment and medications, good tolerability, and has been trying to follow appropriate diet.  Pt denies worsening depressive symptoms, suicidal ideation or panic. No fever, night sweats, wt loss, loss of appetite, or other constitutional symptoms.  Pt states good ability with ADL's, has low fall risk, home safety reviewed and adequate, no other significant changes in hearing or vision, and only occasionally active with exercise.  Has lost significant wt with better diet and exercise. BP Readings from Last 3 Encounters:  04/29/16 140/80  08/01/15 182/104  06/30/15 178/103  Ran out of hct recently, not check BP recently, or had renal function checked.    Denies hyper or hypo thyroid symptoms such as voice, skin or hair change. Past Medical History  Diagnosis Date  . Sarcoidosis (HCC)   . Allergy   . Hyperlipidemia   . Hypertension   . 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 Hypersomnolence 04/29/2012  . 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 OSA (obstructive sleep apnea) 04/29/2012   Past Surgical History  Procedure Laterality Date  . Hernia repair    . Corrective left eye surgery    . Liver biopsy    . Lung biopsy    . Renal biopsy      reports that he quit smoking about 8 years ago. His smoking use included Cigarettes. He has a 1 pack-year smoking history. He has never used smokeless tobacco. He reports that he drinks alcohol. He reports that he does not use illicit drugs. family history includes  Cancer in his father; Hypothyroidism in his mother. No Known Allergies Current Outpatient Prescriptions on File Prior to Visit  Medication Sig Dispense Refill  . aspirin 81 MG EC tablet TAKE 1 TABLET BY MOUTH EVERY DAY 90 tablet 3   No current facility-administered medications on file prior to visit.   Review of Systems Constitutional: Negative for increased diaphoresis, or other activity, appetite or siginficant weight change other than noted HENT: Negative for worsening hearing loss, ear pain, facial swelling, mouth sores and neck stiffness.   Eyes: Negative for other worsening pain, redness or visual disturbance.  Respiratory: Negative for choking or stridor Cardiovascular: Negative for other chest pain and palpitations.  Gastrointestinal: Negative for worsening diarrhea, blood in stool, or abdominal distention Genitourinary: Negative for hematuria, flank pain or change in urine volume.  Musculoskeletal: Negative for myalgias or other joint complaints.  Skin: Negative for other color change and wound or drainage.  Neurological: Negative for syncope and numbness. other than noted Hematological: Negative for adenopathy. or other swelling Psychiatric/Behavioral: Negative for hallucinations, SI, self-injury, decreased concentration or other worsening agitation.      Objective:   Physical Exam BP 140/80 mmHg  Pulse 79  Temp(Src) 98.1 F (36.7 C) (Oral)  Resp 20  Wt 278 lb (126.1 kg)  SpO2 97% VS noted,  Constitutional: Pt is oriented to person, place, and time. Appears well-developed and well-nourished, in no significant distress Head: Normocephalic and atraumatic  Eyes: Conjunctivae and EOM are normal. Pupils are equal, round, and reactive to light Right Ear: External ear normal.  Left Ear: External ear normal Nose: Nose normal.  Mouth/Throat: Oropharynx is clear and moist  Neck: Normal range of motion. Neck supple. No JVD present. No tracheal deviation present or significant  neck LA or mass except has thyroid enlargement bilat right > left likely nodular masses Cardiovascular: Normal rate, regular rhythm, normal heart sounds and intact distal pulses.   Pulmonary/Chest: Effort normal and breath sounds without rales or wheezing  Abdominal: Soft. Bowel sounds are normal. NT. No HSM  Musculoskeletal: Normal range of motion. Exhibits 1+ edema bilat to knees Lymphadenopathy: Has no cervical adenopathy.  Neurological: Pt is alert and oriented to person, place, and time. Pt has normal reflexes. No cranial nerve deficit. Motor grossly intact Skin: Skin is warm and dry. No rash noted or new ulcers Psychiatric:  Has normal mood and affect. Behavior is normal    Assessment & Plan:

## 2016-04-29 NOTE — Patient Instructions (Addendum)
You had the Tetanus (Tdap) shot today  Please continue all other medications as before, and refills have been done if requested.  Please have the pharmacy call with any other refills you may need.  Please continue your efforts at being more active, low cholesterol diet, and weight control.  You are otherwise up to date with prevention measures today.  Please keep your appointments with your specialists as you may have planned  You will be contacted regarding the referral for: thyroid ultrasound  Please go to the LAB in the Basement (turn left off the elevator) for the tests to be done today  You will be contacted by phone if any changes need to be made immediately.  Otherwise, you will receive a letter about your results with an explanation, but please check with MyChart first.  Please remember to sign up for MyChart if you have not done so, as this will be important to you in the future with finding out test results, communicating by private email, and scheduling acute appointments online when needed.  Please return in 6 months, or sooner if needed, with Lab testing done 3-5 days before

## 2016-04-29 NOTE — Progress Notes (Signed)
Pre visit review using our clinic review tool, if applicable. No additional management support is needed unless otherwise documented below in the visit note. 

## 2016-04-30 ENCOUNTER — Other Ambulatory Visit: Payer: Self-pay | Admitting: Internal Medicine

## 2016-04-30 DIAGNOSIS — E042 Nontoxic multinodular goiter: Secondary | ICD-10-CM

## 2016-05-05 NOTE — Assessment & Plan Note (Signed)
Fair control today, for re-start all meds, f/u next visit

## 2016-05-05 NOTE — Assessment & Plan Note (Addendum)
It sounds like Pt has FH of multinod goiter, new onset per pt, for thyroid u/s r/o need for biopsy, consider endo referral

## 2016-05-05 NOTE — Assessment & Plan Note (Signed)
Mild in 2013, for f/u today, ? Renal dysfunction causing edema

## 2016-05-05 NOTE — Assessment & Plan Note (Signed)
?   Control, for f/u lipids today, lower chol diet, consider statin,  to f/u any worsening symptoms or concerns

## 2016-05-05 NOTE — Addendum Note (Signed)
Addended by: Corwin LevinsJOHN, Xzavier Swinger W on: 05/05/2016 07:18 PM   Modules accepted: Level of Service

## 2016-05-05 NOTE — Assessment & Plan Note (Signed)

## 2016-05-06 ENCOUNTER — Ambulatory Visit
Admission: RE | Admit: 2016-05-06 | Discharge: 2016-05-06 | Disposition: A | Discharge status: Home / Self Care | Payer: No Typology Code available for payment source | Source: Ambulatory Visit | Attending: Internal Medicine | Admitting: Internal Medicine

## 2016-05-06 DIAGNOSIS — N184 Chronic kidney disease, stage 4 (severe): Secondary | ICD-10-CM

## 2016-05-06 DIAGNOSIS — E042 Nontoxic multinodular goiter: Secondary | ICD-10-CM

## 2016-05-07 ENCOUNTER — Other Ambulatory Visit: Payer: Self-pay | Admitting: Internal Medicine

## 2016-05-07 ENCOUNTER — Encounter: Payer: Self-pay | Admitting: Internal Medicine

## 2016-05-07 DIAGNOSIS — E041 Nontoxic single thyroid nodule: Secondary | ICD-10-CM

## 2016-05-22 ENCOUNTER — Encounter: Payer: Self-pay | Admitting: Internal Medicine

## 2016-05-30 ENCOUNTER — Other Ambulatory Visit: Payer: Self-pay | Admitting: Surgery

## 2016-05-30 DIAGNOSIS — N185 Chronic kidney disease, stage 5: Secondary | ICD-10-CM

## 2016-05-30 DIAGNOSIS — Z01810 Encounter for preprocedural cardiovascular examination: Secondary | ICD-10-CM

## 2016-06-04 ENCOUNTER — Ambulatory Visit
Admission: RE | Admit: 2016-06-04 | Discharge: 2016-06-04 | Disposition: A | Discharge status: Home / Self Care | Payer: No Typology Code available for payment source | Source: Ambulatory Visit | Attending: Internal Medicine | Admitting: Internal Medicine

## 2016-06-04 ENCOUNTER — Other Ambulatory Visit (HOSPITAL_COMMUNITY)
Admission: RE | Admit: 2016-06-04 | Discharge: 2016-06-04 | Disposition: A | Discharge status: Home / Self Care | Payer: No Typology Code available for payment source | Source: Ambulatory Visit | Attending: Interventional Radiology | Admitting: Interventional Radiology

## 2016-06-04 DIAGNOSIS — E041 Nontoxic single thyroid nodule: Secondary | ICD-10-CM | POA: Insufficient documentation

## 2016-06-05 ENCOUNTER — Encounter: Payer: Self-pay | Admitting: Surgery

## 2016-06-09 ENCOUNTER — Other Ambulatory Visit (HOSPITAL_COMMUNITY): Payer: Self-pay

## 2016-06-09 ENCOUNTER — Ambulatory Visit: Payer: Self-pay | Admitting: Surgery

## 2016-06-09 ENCOUNTER — Encounter (HOSPITAL_COMMUNITY): Payer: Self-pay

## 2016-07-02 ENCOUNTER — Other Ambulatory Visit (HOSPITAL_COMMUNITY): Payer: Self-pay | Admitting: Nephrology

## 2016-07-02 DIAGNOSIS — N051 Unspecified nephritic syndrome with focal and segmental glomerular lesions: Secondary | ICD-10-CM

## 2016-07-09 ENCOUNTER — Telehealth: Payer: Self-pay | Admitting: Emergency Medicine

## 2016-07-09 NOTE — Telephone Encounter (Signed)
Pt called and wanted to know if he can get the results from his thyroid ultrasound. Please advise thanks.

## 2016-07-09 NOTE — Telephone Encounter (Signed)
The July 2017 thyroid biopsy was negative for malignancy  No other evaluation needed at this time

## 2016-07-10 ENCOUNTER — Other Ambulatory Visit: Payer: Self-pay | Admitting: Radiology

## 2016-07-10 ENCOUNTER — Other Ambulatory Visit: Payer: Self-pay | Admitting: General Surgery

## 2016-07-10 NOTE — Telephone Encounter (Signed)
Called patient unable to reach left message to give us a call back.

## 2016-07-11 ENCOUNTER — Encounter (HOSPITAL_COMMUNITY): Payer: Self-pay

## 2016-07-11 ENCOUNTER — Ambulatory Visit (HOSPITAL_COMMUNITY)
Admission: RE | Admit: 2016-07-11 | Discharge: 2016-07-11 | Disposition: A | Discharge status: Home / Self Care | Payer: No Typology Code available for payment source | Source: Ambulatory Visit | Attending: Nephrology | Admitting: Nephrology

## 2016-07-11 DIAGNOSIS — I129 Hypertensive chronic kidney disease with stage 1 through stage 4 chronic kidney disease, or unspecified chronic kidney disease: Secondary | ICD-10-CM | POA: Insufficient documentation

## 2016-07-11 DIAGNOSIS — N269 Renal sclerosis, unspecified: Secondary | ICD-10-CM | POA: Insufficient documentation

## 2016-07-11 DIAGNOSIS — N051 Unspecified nephritic syndrome with focal and segmental glomerular lesions: Secondary | ICD-10-CM | POA: Insufficient documentation

## 2016-07-11 DIAGNOSIS — Z79899 Other long term (current) drug therapy: Secondary | ICD-10-CM | POA: Insufficient documentation

## 2016-07-11 DIAGNOSIS — E785 Hyperlipidemia, unspecified: Secondary | ICD-10-CM | POA: Insufficient documentation

## 2016-07-11 DIAGNOSIS — G4733 Obstructive sleep apnea (adult) (pediatric): Secondary | ICD-10-CM | POA: Insufficient documentation

## 2016-07-11 DIAGNOSIS — N189 Chronic kidney disease, unspecified: Secondary | ICD-10-CM | POA: Insufficient documentation

## 2016-07-11 DIAGNOSIS — D869 Sarcoidosis, unspecified: Secondary | ICD-10-CM | POA: Insufficient documentation

## 2016-07-11 DIAGNOSIS — Z7982 Long term (current) use of aspirin: Secondary | ICD-10-CM | POA: Insufficient documentation

## 2016-07-11 DIAGNOSIS — Z87891 Personal history of nicotine dependence: Secondary | ICD-10-CM | POA: Insufficient documentation

## 2016-07-11 LAB — CBC
HEMATOCRIT: 35.2 % — AB (ref 39.0–52.0)
HEMOGLOBIN: 11.5 g/dL — AB (ref 13.0–17.0)
MCH: 28.3 pg (ref 26.0–34.0)
MCHC: 32.7 g/dL (ref 30.0–36.0)
MCV: 86.7 fL (ref 78.0–100.0)
Platelets: 225 10*3/uL (ref 150–400)
RBC: 4.06 MIL/uL — AB (ref 4.22–5.81)
RDW: 13.2 % (ref 11.5–15.5)
WBC: 6.2 10*3/uL (ref 4.0–10.5)

## 2016-07-11 LAB — PROTIME-INR
INR: 0.94
Prothrombin Time: 12.6 seconds (ref 11.4–15.2)

## 2016-07-11 LAB — APTT: APTT: 25 s (ref 24–36)

## 2016-07-11 MED ORDER — FENTANYL CITRATE (PF) 100 MCG/2ML IJ SOLN
INTRAMUSCULAR | Status: AC | PRN
  Administered 2016-07-11 (×2): 50 ug via INTRAVENOUS
  Administered 2016-07-11: 25 ug via INTRAVENOUS

## 2016-07-11 MED ORDER — MIDAZOLAM HCL 2 MG/2ML IJ SOLN
INTRAMUSCULAR | Status: AC
  Filled 2016-07-11: qty 2

## 2016-07-11 MED ORDER — SODIUM CHLORIDE 0.9 % IV SOLN
INTRAVENOUS | Status: AC | PRN
  Administered 2016-07-11: 10 mL/h via INTRAVENOUS

## 2016-07-11 MED ORDER — MIDAZOLAM HCL 2 MG/2ML IJ SOLN
INTRAMUSCULAR | Status: AC | PRN
  Administered 2016-07-11 (×3): 1 mg via INTRAVENOUS

## 2016-07-11 MED ORDER — FENTANYL CITRATE (PF) 100 MCG/2ML IJ SOLN
INTRAMUSCULAR | Status: DC
Start: 2016-07-11 — End: 2016-07-12
  Filled 2016-07-11: qty 2

## 2016-07-11 MED ORDER — LIDOCAINE HCL (PF) 1 % IJ SOLN
INTRAMUSCULAR | Status: DC
Start: 2016-07-11 — End: 2016-07-12
  Filled 2016-07-11: qty 10

## 2016-07-11 MED ORDER — HYDROCODONE-ACETAMINOPHEN 5-325 MG PO TABS: 1.0000 | ORAL_TABLET | ORAL | Status: DC | PRN

## 2016-07-11 MED ORDER — FENTANYL CITRATE (PF) 100 MCG/2ML IJ SOLN
INTRAMUSCULAR | Status: AC
  Filled 2016-07-11: qty 2

## 2016-07-11 MED ORDER — SODIUM CHLORIDE 0.9 % IV SOLN: INTRAVENOUS | Status: DC

## 2016-07-11 NOTE — Procedures (Signed)
RLP renal core bx 16g to surg path No complication No blood loss. [facility].

## 2016-07-11 NOTE — Sedation Documentation (Signed)
Patient is resting comfortably. 

## 2016-07-11 NOTE — H&P (Signed)
Chief Complaint: Patient was seen in consultation today for random renal biopsy at the request of [name],Devon  Referring Physician(s): [name],Devon  Supervising Physician: Devon [name]  Patient Status: Outpatient  History of Present Illness: Devon [name] is a 50 y.o. male   Hx sarcoidosis Note change and rising creatinine  Referred to Dr Devon [name] Proteinuria; focal segmental glomerulosclerosis Now scheduled for random renal biopsy   Past Medical History:  Diagnosis Date  . 5Th nerve palsy    left , related to sarcoid  . Allergy   . Hyperlipidemia   . Hypersomnolence 04/29/2012  . Hypertension   . OSA (obstructive sleep apnea) 04/29/2012  . Sarcoidosis (HCC)   . 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 corrective left eye surgery    . HERNIA REPAIR    . LIVER BIOPSY    . LUNG BIOPSY    . RENAL BIOPSY      Allergies: Review of patient's allergies indicates no known allergies.  Medications: Prior to Admission medications   Medication Sig Start Date End Date Taking? Authorizing Provider  amLODipine (NORVASC) 10 MG tablet Take 1 tablet (10 mg total) by mouth daily. 04/29/16  Yes Corwin Levins, MD  aspirin 81 MG EC tablet TAKE 1 TABLET BY MOUTH EVERY DAY 05/27/13  Yes Corwin Levins, MD  hydrALAZINE (APRESOLINE) 50 MG tablet Take 1 tablet (50 mg total) by mouth 3 (three) times daily. 04/29/16  Yes Corwin Levins, MD  hydrochlorothiazide (HYDRODIURIL) 25 MG tablet Take 1 tablet (25 mg total) by mouth daily. Yearly physical is due w/labs must see md for refills 04/29/16  Yes Corwin Levins, MD     Family History  Problem Relation Age of Onset  . Hypothyroidism Mother   . Cancer Father     Social History   Social History  . Marital status: Single    Spouse name: N/A  . Number of children: N/A  . Years of education: N/A   Occupational History  . [employer] Tech.    Social History Main Topics  .  Smoking status: Former Smoker    Packs/day: 0.50    Years: 2.00    Types: Cigarettes    Quit date: 11/18/2007  . Smokeless tobacco: Never Used  . Alcohol use Yes     Comment: occasional social  . Drug use: No  . Sexual activity: Not Asked   Other Topics Concern  . None   Social History Narrative  . None     Review of Systems: A 12 point ROS discussed and pertinent positives are indicated in the HPI above.  All other systems are negative.  Review of Systems  Constitutional: Negative for activity change, appetite change, fatigue and fever.  Respiratory: Negative for shortness of breath.   Cardiovascular: Negative for chest pain and leg swelling.  Gastrointestinal: Negative for abdominal pain.  Genitourinary: Negative for difficulty urinating.  Musculoskeletal: Negative for gait problem.  Neurological: Negative for weakness.  Psychiatric/Behavioral: Negative for behavioral problems and confusion.    Vital Signs: BP (!) 159/98   Pulse 80   Temp 98.9 F (37.2 C) (Oral)   Resp 18   Ht 6\' 5"  (1.956 m)   Wt 280 lb (127 kg)   SpO2 95%   BMI 33.20 kg/m   Physical Exam  Constitutional: He is oriented to person, place, and time. He appears well-nourished.  Cardiovascular: Normal rate, regular rhythm  and normal heart sounds.   Pulmonary/Chest: Effort normal and breath sounds normal. He has no wheezes.  Abdominal: Soft. Bowel sounds are normal. There is no tenderness.  Musculoskeletal: Normal range of motion.  Neurological: He is alert and oriented to person, place, and time.  Skin: Skin is warm and dry.  Psychiatric: He has a normal mood and affect. His behavior is normal. Judgment and thought content normal.  Nursing note and vitals reviewed.   Mallampati Score:  MD Evaluation Airway: WNL Heart: WNL Abdomen: WNL Chest/ Lungs: WNL ASA  Classification: 2 Mallampati/Airway Score: One  Imaging: No results found.  Labs:  CBC:  Recent Labs  04/29/16 1651  07/11/16 1100  WBC 6.3 6.2  HGB 12.8* 11.5*  HCT 38.2* 35.2*  PLT 273.0 225    COAGS:  Recent Labs  07/11/16 1100  INR 0.94  APTT 25    BMP:  Recent Labs  04/29/16 1651  NA 140  K 4.6  CL 105  CO2 27  GLUCOSE 86  BUN 41*  CALCIUM 9.0  CREATININE 4.30*    LIVER FUNCTION TESTS:  Recent Labs  04/29/16 1651  BILITOT 0.5  AST 23  ALT 17  ALKPHOS 64  PROT 5.9*  ALBUMIN 3.4*    TUMOR MARKERS: No results for input(s): AFPTM, CEA, CA199, CHROMGRNA in the last 8760 hours.  Assessment and Plan:  Rising creatinine Glomerulonephritis Proteinuria Scheduled for random renal biopsy Risks and Benefits discussed with the patient including, but not limited to bleeding, infection, damage to adjacent structures or low yield requiring additional tests. All of the patient's questions were answered, patient is agreeable to proceed. Consent signed and in chart.   Thank you for this interesting consult.  I greatly enjoyed meeting Devon [name] and look forward to participating in their care.  A copy of this report was sent to the requesting provider on this date.  Electronically Signed: Ralene MuskratURPIN,Kainan Patty A 07/11/2016, 12:07 PM   I spent a total of  30 Minutes   in face to face in clinical consultation, greater than 50% of which was counseling/coordinating care for random renal biopsy

## 2016-07-11 NOTE — Sedation Documentation (Signed)
Patient denies pain and is resting comfortably. Friend at bedside

## 2016-07-11 NOTE — Discharge Instructions (Signed)
Kidney Biopsy, Care After Refer to this sheet in the next few weeks. These instructions provide you with information on caring for yourself after your procedure. Your health care provider may also give you more specific instructions. Your treatment has been planned according to current medical practices, but problems sometimes occur. Call your health care provider if you have any problems or questions after your procedure.  WHAT TO EXPECT AFTER THE PROCEDURE   You may notice blood in the urine for the first 24 hours after the biopsy.  You may feel some pain at the biopsy site for 1-2 weeks after the biopsy. HOME CARE INSTRUCTIONS  Do not lift anything heavier than 10 lb (4.5 kg) for 2 weeks.  Do not take any non-steroidal anti-inflammatory drugs (NSAIDs) or any blood thinners for a week after the biopsy unless instructed to do so by your health care provider.  Only take medicines for pain, fever, or discomfort as directed by your health care provider. SEEK MEDICAL CARE IF:  You have bloody urine more than 24 hours after the biopsy.   You develop a fever.   You cannot urinate.   You have increasing pain at the biopsy site.  SEEK IMMEDIATE MEDICAL CARE IF: You feel faint or dizzy.    This information is not intended to replace advice given to you by your health care provider. Make sure you discuss any questions you have with your health care provider.   Document Released: 07/06/2013 Document Reviewed: 07/06/2013 Elsevier Interactive Patient Education [employer]2016 Elsevier Inc. Excuse from Work, [employer], or Physical Activity Ellamae SiaRedondo Levesque  needs to be excused from: ____x_ Work _____ [employer] _____ Physical activity On August 25,2017 ___x__ He or she may return to work on Aug 28 but should still avoid the following physical activity or activities from now until  [employer]_September 8,2017_. Activity restrictions include: ___x__ Lifting more than _____10__ lb  Health Care Provider Name  (printed): _Lisa Vlada Uriostegui,RN_______________________________________  Health Care Provider (signature): ___________________________________________ Date: ________________   This information is not intended to replace advice given to you by your health care provider. Make sure you discuss any questions you have with your health care provider.   Document Released: 04/29/2001 Document Revised: 11/24/2014 Document Reviewed: 06/05/2014 Elsevier Interactive Patient Education [employer]2016 Elsevier Inc.

## 2016-07-11 NOTE — Sedation Documentation (Addendum)
Patient is resting comfortably.  Denies pain.

## 2016-07-25 ENCOUNTER — Encounter (HOSPITAL_COMMUNITY): Payer: Self-pay

## 2016-08-19 ENCOUNTER — Encounter: Payer: Self-pay | Admitting: Vascular Surgery

## 2016-08-22 ENCOUNTER — Ambulatory Visit (INDEPENDENT_AMBULATORY_CARE_PROVIDER_SITE_OTHER)
Admission: RE | Admit: 2016-08-22 | Discharge: 2016-08-22 | Disposition: A | Discharge status: Home / Self Care | Payer: Medicaid Other | Source: Ambulatory Visit | Attending: Surgery | Admitting: Surgery

## 2016-08-22 ENCOUNTER — Encounter: Payer: Self-pay | Admitting: Vascular Surgery

## 2016-08-22 ENCOUNTER — Other Ambulatory Visit: Payer: Self-pay

## 2016-08-22 ENCOUNTER — Ambulatory Visit (INDEPENDENT_AMBULATORY_CARE_PROVIDER_SITE_OTHER): Payer: Self-pay | Admitting: Vascular Surgery

## 2016-08-22 ENCOUNTER — Ambulatory Visit (HOSPITAL_COMMUNITY)
Admission: RE | Admit: 2016-08-22 | Discharge: 2016-08-22 | Disposition: A | Discharge status: Home / Self Care | Payer: Medicaid Other | Source: Ambulatory Visit | Attending: Surgery | Admitting: Surgery

## 2016-08-22 VITALS — BP 148/98 | HR 71 | Temp 97.5°F | Resp 20 | Ht 77.0 in | Wt 274.5 lb

## 2016-08-22 DIAGNOSIS — N185 Chronic kidney disease, stage 5: Secondary | ICD-10-CM

## 2016-08-22 DIAGNOSIS — Z01810 Encounter for preprocedural cardiovascular examination: Secondary | ICD-10-CM | POA: Insufficient documentation

## 2016-08-22 NOTE — Progress Notes (Signed)
  Referred by:  [name] [name], MD [address] [place], [place] [address]  Reason for referral: New access  History of Present Illness  Devon [name] is a 50 y.o. (09/01/1966) male who presents for evaluation for permanent access.  The patient is right hand dominant.  The patient has not had previous access procedures.  Previous central venous cannulation procedures include: none.  The patient has never had a PPM placed.   Past Medical History:  Diagnosis Date  . 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 Allergy   . Chronic kidney disease   . Hyperlipidemia   . Hypersomnolence 04/29/2012  . Hypertension   . OSA (obstructive sleep apnea) 04/29/2012  . Sarcoidosis (HCC)   . 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 corrective left eye surgery    . HERNIA REPAIR    . LIVER BIOPSY    . LUNG BIOPSY    . RENAL BIOPSY      Social History   Social History  . Marital status: Single    Spouse name: N/A  . Number of children: N/A  . Years of education: N/A   Occupational History  . Logistics Tech.    Social History Main Topics  . Smoking status: Current Every Day Smoker    Packs/day: 0.50    Years: 0.50    Types: Cigarettes    Last attempt to quit: 11/18/2007  . Smokeless tobacco: Never Used  . Alcohol use Yes     Comment: occasional social  . Drug use: No  . Sexual activity: Not on file   Other Topics Concern  . Not on file   Social History Narrative  . No narrative on file    Family History  Problem Relation Age of Onset  . Hypothyroidism Mother   . Cancer Father     Current Outpatient Prescriptions  Medication Sig Dispense Refill  . amLODipine (NORVASC) 10 MG tablet Take 1 tablet (10 mg total) by mouth daily. 90 tablet 3  . aspirin 81 MG EC tablet TAKE 1 TABLET BY MOUTH EVERY DAY 90 tablet 3  . hydrALAZINE (APRESOLINE) 50 MG tablet Take 1 tablet (50 mg total) by mouth 3  (three) times daily. (Patient taking differently: Take 100 mg by mouth 3 (three) times daily. ) 270 tablet 3  . hydrochlorothiazide (HYDRODIURIL) 25 MG tablet Take 1 tablet (25 mg total) by mouth daily. Yearly physical is due w/labs must see md for refills 90 tablet 3   No current facility-administered medications for this visit.     No Known Allergies  REVIEW OF SYSTEMS:   Cardiac:  positive for: no symptoms, negative for: Chest pain or chest pressure, Shortness of breath upon exertion and Shortness of breath when lying flat,   Vascular:  positive for: Leg swelling,  negative for: Pain in calf, thigh, or hip brought on by ambulation, Pain in feet at night that wakes you up from your sleep and Blood clot in your veins  Pulmonary:  positive for: no symptoms,  negative for: Oxygen at home, Productive cough and Wheezing  Neurologic:  positive for: No symptoms, negative for: Sudden weakness in arms or legs, Sudden numbness in arms or legs, Sudden onset of difficulty speaking or slurred speech, Temporary loss of vision in one eye and Problems with dizziness  Gastrointestinal:  positive for: no symptoms, negative for:   Blood in stool and Vomited blood  Genitourinary:  positive for: no symptoms, negative for: Burning when urinating and Blood in urine  Psychiatric:  positive for: no symptoms,  negative for: Major depression  Hematologic:  positive for: no symptoms,  negative for: negative for: Bleeding problems and Problems with blood clotting too easily  Dermatologic:  positive for: no symptoms, negative for: Rashes or ulcers  Constitutional:  positive for: no symptoms, negative for: Fever or chills  Ear/Nose/Throat:  positive for: no symptoms, negative for: Change in hearing, Nose bleeds and Sore throat  Musculoskeletal:  positive for: no symptoms, negative for: Back pain, Joint pain and Muscle pain   Physical Examination  Vitals:   08/22/16 0920 08/22/16 0926    BP: (!) 152/94 (!) 148/98  Pulse: 71   Resp: 20   Temp: 97.5 F (36.4 C)   TempSrc: Oral   SpO2: 99%   Weight: 274 lb 8 oz (124.5 kg)   Height: 6' 5" (1.956 m)     Body mass index is 32.55 kg/m.  General: Alert, O x 3, WD,NAD  Head: [place]/AT,   Ear/Nose/Throat: Hearing grossly intact, nares without erythema or drainage, oropharynx without Erythema or Exudate , Mallampati score: 3, Dentition intact  Eyes: PERRLA, EOMI,   Neck: Supple, mid-line trachea,    Pulmonary: Sym exp, good B air movt,CTA B  Cardiac: RRR, Nl S1, S2, no Murmurs, No rubs, No S3,S4  Vascular: Vessel Right Left  Radial Palpable Palpable  Brachial Palpable Palpable  Carotid Palpable, No Bruit Palpable, No Bruit  Aorta Not palpable N/A  Femoral Palpable Palpable  Popliteal Not palpable Not palpable  PT Palpable Palpable  DP Palpable Palpable   Gastrointestinal: soft, non-distended, non-tender to palpation, No guarding or rebound, no HSM, no masses, no CVAT B, No palpable prominent aortic pulse,    Musculoskeletal: M/S 5/5 throughout  , Extremities without ischemic changes  , No edema present,  , No LDS present  Neurologic: CN 2-12 intact , Pain and light touch intact in extremities , Motor exam as listed above  Psychiatric: Judgement intact, Mood & affect appropriate for pt's clinical situation  Dermatologic: See M/S exam for extremity exam, No rashes otherwise noted  Lymph : Palpable lymph nodes: None   Non-Invasive Vascular Imaging  Vein Mapping  (Date: 08/22/2016):   R arm: acceptable vein conduits include none  L arm: acceptable vein conduits include none  BUE Doppler (Date: 08/22/2016):   R arm:   Brachial: tri, 6.9 mm  Radial: tri, 3.2 mm  Ulnar: tri, 2.8 mm  L arm:   Brachial: tri, 6.3 mm  Radial: tri, 3.7 mm  Ulnar: tri, 2.4 mm    Outside Studies/Documentation 3 pages of outside documents were reviewed including: nephrology chart.   Medical Decision  Making  Devon [name] is a 50 y.o. male who presents with chronic kidney disease stage V   Based on vein mapping and examination, this patient's permanent access options include: L staged BRVT, R staged BRVT.  I would start with L staged BRVT  I had an extensive discussion with this patient in regards to the nature of access surgery, including risk, benefits, and alternatives.    The patient is aware that the risks of access surgery include but are not limited to: bleeding, infection, steal syndrome, nerve damage, ischemic monomelic neuropathy, failure of access to mature, and possible need for additional access procedures in the future. I discussed with the patient the nature of the staged access procedure,   specifically the need for a second operation to transpose the first stage fistula if it matures adequately.   The patient has NOT agreed to proceed with the above procedure.  He will contact us when he is ready to proceed.   Brian Chen, MD [facility] [place] Office: [phone] Pager: [phone]  08/22/2016, 10:18 AM    

## 2016-08-25 ENCOUNTER — Encounter (HOSPITAL_COMMUNITY): Payer: Self-pay

## 2016-08-25 ENCOUNTER — Ambulatory Visit: Payer: Self-pay | Admitting: Surgery

## 2016-08-25 ENCOUNTER — Other Ambulatory Visit (HOSPITAL_COMMUNITY): Payer: Self-pay

## 2016-09-02 ENCOUNTER — Encounter (HOSPITAL_COMMUNITY): Payer: Self-pay | Admitting: *Deleted

## 2016-09-02 NOTE — Progress Notes (Signed)
Spoke with pt for pre-op call. Pt denies cardiac history, chest pain or sob. 

## 2016-09-02 NOTE — Anesthesia Preprocedure Evaluation (Addendum)
Anesthesia Evaluation  Patient identified by MRN, date of birth, ID band Patient awake    Reviewed: Allergy & Precautions, NPO status , Patient's Chart, lab work & pertinent test results  Airway Mallampati: I  TM Distance: >3 FB Neck ROM: Full    Dental  (+) Teeth Intact, Dental Advisory Given   Pulmonary sleep apnea , former smoker,    Pulmonary exam normal        Cardiovascular hypertension, Pt. on medications  Rhythm:Regular Rate:Normal     Neuro/Psych negative neurological ROS  negative psych ROS   GI/Hepatic negative GI ROS, Neg liver ROS,   Endo/Other  negative endocrine ROS  Renal/GU CRFRenal disease  negative genitourinary   Musculoskeletal negative musculoskeletal ROS (+)   Abdominal   Peds negative pediatric ROS (+)  Hematology negative hematology ROS (+)   Anesthesia Other Findings   Reproductive/Obstetrics negative OB ROS                          Anesthesia Physical Anesthesia Plan  ASA: III  Anesthesia Plan: MAC   Post-op Pain Management:    Induction: Intravenous  Airway Management Planned: Simple Face Mask  Additional Equipment:   Intra-op Plan:   Post-operative Plan:   Informed Consent: I have reviewed the patients History and Physical, chart, labs and discussed the procedure including the risks, benefits and alternatives for the proposed anesthesia with the patient or authorized representative who has indicated his/her understanding and acceptance.   Dental advisory given  Plan Discussed with:   Anesthesia Plan Comments:         Anesthesia Quick Evaluation

## 2016-09-03 ENCOUNTER — Encounter (HOSPITAL_COMMUNITY)
Admission: RE | Disposition: A | Discharge status: Home / Self Care | Payer: Self-pay | Source: Ambulatory Visit | Attending: Vascular Surgery

## 2016-09-03 ENCOUNTER — Ambulatory Visit (HOSPITAL_COMMUNITY): Payer: Medicaid Other | Admitting: Certified Registered Nurse Anesthetist

## 2016-09-03 ENCOUNTER — Encounter (HOSPITAL_COMMUNITY): Payer: Self-pay | Admitting: Certified Registered"

## 2016-09-03 ENCOUNTER — Ambulatory Visit (HOSPITAL_COMMUNITY)
Admission: RE | Admit: 2016-09-03 | Discharge: 2016-09-03 | Disposition: A | Discharge status: Home / Self Care | Payer: Medicaid Other | Source: Ambulatory Visit | Attending: Vascular Surgery | Admitting: Vascular Surgery

## 2016-09-03 DIAGNOSIS — I120 Hypertensive chronic kidney disease with stage 5 chronic kidney disease or end stage renal disease: Secondary | ICD-10-CM | POA: Diagnosis not present

## 2016-09-03 DIAGNOSIS — F17210 Nicotine dependence, cigarettes, uncomplicated: Secondary | ICD-10-CM | POA: Insufficient documentation

## 2016-09-03 DIAGNOSIS — Z79899 Other long term (current) drug therapy: Secondary | ICD-10-CM | POA: Insufficient documentation

## 2016-09-03 DIAGNOSIS — Z7982 Long term (current) use of aspirin: Secondary | ICD-10-CM | POA: Insufficient documentation

## 2016-09-03 DIAGNOSIS — Z8349 Family history of other endocrine, nutritional and metabolic diseases: Secondary | ICD-10-CM | POA: Insufficient documentation

## 2016-09-03 DIAGNOSIS — I8393 Asymptomatic varicose veins of bilateral lower extremities: Secondary | ICD-10-CM | POA: Insufficient documentation

## 2016-09-03 DIAGNOSIS — I440 Atrioventricular block, first degree: Secondary | ICD-10-CM | POA: Diagnosis not present

## 2016-09-03 DIAGNOSIS — G4733 Obstructive sleep apnea (adult) (pediatric): Secondary | ICD-10-CM | POA: Insufficient documentation

## 2016-09-03 DIAGNOSIS — N185 Chronic kidney disease, stage 5: Secondary | ICD-10-CM | POA: Insufficient documentation

## 2016-09-03 DIAGNOSIS — Z809 Family history of malignant neoplasm, unspecified: Secondary | ICD-10-CM | POA: Insufficient documentation

## 2016-09-03 DIAGNOSIS — G4710 Hypersomnia, unspecified: Secondary | ICD-10-CM | POA: Insufficient documentation

## 2016-09-03 DIAGNOSIS — G588 Other specified mononeuropathies: Secondary | ICD-10-CM | POA: Insufficient documentation

## 2016-09-03 DIAGNOSIS — N186 End stage renal disease: Secondary | ICD-10-CM | POA: Diagnosis present

## 2016-09-03 DIAGNOSIS — D869 Sarcoidosis, unspecified: Secondary | ICD-10-CM | POA: Insufficient documentation

## 2016-09-03 DIAGNOSIS — E785 Hyperlipidemia, unspecified: Secondary | ICD-10-CM | POA: Insufficient documentation

## 2016-09-03 HISTORY — DX: Pneumonia, unspecified organism: J18.9

## 2016-09-03 HISTORY — PX: BASCILIC VEIN TRANSPOSITION: SHX5742

## 2016-09-03 LAB — POCT I-STAT 4, (NA,K, GLUC, HGB,HCT)
Glucose, Bld: 97 mg/dL (ref 65–99)
HCT: 30 % — ABNORMAL LOW (ref 39.0–52.0)
Hemoglobin: 10.2 g/dL — ABNORMAL LOW (ref 13.0–17.0)
POTASSIUM: 3.6 mmol/L (ref 3.5–5.1)
SODIUM: 143 mmol/L (ref 135–145)

## 2016-09-03 SURGERY — TRANSPOSITION, VEIN, BASILIC
Anesthesia: Monitor Anesthesia Care | Site: Arm Upper | Laterality: Left

## 2016-09-03 MED ORDER — LIDOCAINE HCL (CARDIAC) 20 MG/ML IV SOLN
INTRAVENOUS | Status: DC | PRN
  Administered 2016-09-03: 60 mg via INTRATRACHEAL

## 2016-09-03 MED ORDER — 0.9 % SODIUM CHLORIDE (POUR BTL) OPTIME
TOPICAL | Status: DC | PRN
  Administered 2016-09-03: 1000 mL

## 2016-09-03 MED ORDER — LIDOCAINE HCL (PF) 1 % IJ SOLN
INTRAMUSCULAR | Status: DC | PRN
  Administered 2016-09-03: 30 mL

## 2016-09-03 MED ORDER — PROPOFOL 10 MG/ML IV BOLUS
INTRAVENOUS | Status: AC
  Filled 2016-09-03: qty 20

## 2016-09-03 MED ORDER — CHLORHEXIDINE GLUCONATE CLOTH 2 % EX PADS: 6.0000 | MEDICATED_PAD | Freq: Once | CUTANEOUS | Status: DC

## 2016-09-03 MED ORDER — PROMETHAZINE HCL 25 MG/ML IJ SOLN: 6.2500 mg | INTRAMUSCULAR | Status: DC | PRN

## 2016-09-03 MED ORDER — PROPOFOL 1000 MG/100ML IV EMUL
INTRAVENOUS | Status: AC
  Filled 2016-09-03: qty 100

## 2016-09-03 MED ORDER — MIDAZOLAM HCL 2 MG/2ML IJ SOLN
INTRAMUSCULAR | Status: AC
  Filled 2016-09-03: qty 2

## 2016-09-03 MED ORDER — LIDOCAINE HCL (PF) 1 % IJ SOLN
INTRAMUSCULAR | Status: AC
  Filled 2016-09-03: qty 30

## 2016-09-03 MED ORDER — PHENYLEPHRINE 40 MCG/ML (10ML) SYRINGE FOR IV PUSH (FOR BLOOD PRESSURE SUPPORT)
PREFILLED_SYRINGE | INTRAVENOUS | Status: AC
  Filled 2016-09-03: qty 10

## 2016-09-03 MED ORDER — SODIUM CHLORIDE 0.9 % IV SOLN
INTRAVENOUS | Status: DC | PRN
  Administered 2016-09-03: 09:00:00

## 2016-09-03 MED ORDER — LIDOCAINE 2% (20 MG/ML) 5 ML SYRINGE
INTRAMUSCULAR | Status: AC
  Filled 2016-09-03: qty 5

## 2016-09-03 MED ORDER — SODIUM CHLORIDE 0.9 % IV SOLN
INTRAVENOUS | Status: DC | PRN
  Administered 2016-09-03 (×2): via INTRAVENOUS

## 2016-09-03 MED ORDER — OXYCODONE HCL 5 MG PO TABS: 5.0000 mg | ORAL_TABLET | Freq: Once | ORAL | Status: DC | PRN

## 2016-09-03 MED ORDER — OXYCODONE HCL 5 MG/5ML PO SOLN: 5.0000 mg | Freq: Once | ORAL | Status: DC | PRN

## 2016-09-03 MED ORDER — PHENYLEPHRINE HCL 10 MG/ML IJ SOLN
INTRAMUSCULAR | Status: DC | PRN
  Administered 2016-09-03: 120 ug via INTRAVENOUS

## 2016-09-03 MED ORDER — OXYCODONE-ACETAMINOPHEN 5-325 MG PO TABS: 1.0000 | ORAL_TABLET | Freq: Four times a day (QID) | ORAL | 0 refills | Status: DC | PRN

## 2016-09-03 MED ORDER — MIDAZOLAM HCL 2 MG/2ML IJ SOLN
INTRAMUSCULAR | Status: DC | PRN
  Administered 2016-09-03: 2 mg via INTRAVENOUS

## 2016-09-03 MED ORDER — FENTANYL CITRATE (PF) 100 MCG/2ML IJ SOLN
INTRAMUSCULAR | Status: AC
Start: 2016-09-03 — End: 2016-09-03
  Filled 2016-09-03: qty 2

## 2016-09-03 MED ORDER — HYDRALAZINE HCL 50 MG PO TABS: 100.0000 mg | ORAL_TABLET | Freq: Three times a day (TID) | ORAL | Status: DC

## 2016-09-03 MED ORDER — CEFUROXIME SODIUM 1.5 G IJ SOLR
1.5000 g | INTRAMUSCULAR | Status: AC
  Administered 2016-09-03: 1.5 g via INTRAVENOUS
  Filled 2016-09-03 (×2): qty 1.5

## 2016-09-03 MED ORDER — PROPOFOL 500 MG/50ML IV EMUL
INTRAVENOUS | Status: DC | PRN
  Administered 2016-09-03: 50 ug/kg/min via INTRAVENOUS

## 2016-09-03 MED ORDER — PROPOFOL 10 MG/ML IV BOLUS
INTRAVENOUS | Status: DC | PRN
  Administered 2016-09-03: 20 mg via INTRAVENOUS
  Administered 2016-09-03: 10 mg via INTRAVENOUS
  Administered 2016-09-03: 30 mg via INTRAVENOUS

## 2016-09-03 MED ORDER — FENTANYL CITRATE (PF) 100 MCG/2ML IJ SOLN
INTRAMUSCULAR | Status: DC | PRN
  Administered 2016-09-03 (×2): 50 ug via INTRAVENOUS

## 2016-09-03 MED ORDER — CEFUROXIME SODIUM 1.5 G IJ SOLR
INTRAMUSCULAR | Status: AC
  Filled 2016-09-03: qty 1.5

## 2016-09-03 MED ORDER — SODIUM CHLORIDE 0.9 % IV SOLN: INTRAVENOUS | Status: DC

## 2016-09-03 SURGICAL SUPPLY — 36 items
ARMBAND PINK RESTRICT EXTREMIT (MISCELLANEOUS) ×3 IMPLANT
CANISTER SUCTION 2500CC (MISCELLANEOUS) ×3 IMPLANT
CLIP TI MEDIUM 24 (CLIP) ×3 IMPLANT
CLIP TI WIDE RED SMALL 24 (CLIP) ×3 IMPLANT
CORDS BIPOLAR (ELECTRODE) IMPLANT
COVER PROBE W GEL 5X96 (DRAPES) ×3 IMPLANT
DECANTER SPIKE VIAL GLASS SM (MISCELLANEOUS) ×3 IMPLANT
DERMABOND ADVANCED (GAUZE/BANDAGES/DRESSINGS) ×2
DERMABOND ADVANCED .7 DNX12 (GAUZE/BANDAGES/DRESSINGS) ×1 IMPLANT
ELECT REM PT RETURN 9FT ADLT (ELECTROSURGICAL) ×3
ELECTRODE REM PT RTRN 9FT ADLT (ELECTROSURGICAL) ×1 IMPLANT
GLOVE BIO SURGEON STRL SZ7 (GLOVE) ×6 IMPLANT
GLOVE BIOGEL PI IND STRL 6.5 (GLOVE) ×1 IMPLANT
GLOVE BIOGEL PI IND STRL 7.5 (GLOVE) ×1 IMPLANT
GLOVE BIOGEL PI INDICATOR 6.5 (GLOVE) ×2
GLOVE BIOGEL PI INDICATOR 7.5 (GLOVE) ×2
GLOVE ECLIPSE 6.5 STRL STRAW (GLOVE) ×3 IMPLANT
GLOVE INDICATOR 7.0 STRL GRN (GLOVE) ×6 IMPLANT
GOWN STRL REUS W/ TWL LRG LVL3 (GOWN DISPOSABLE) ×3 IMPLANT
GOWN STRL REUS W/TWL LRG LVL3 (GOWN DISPOSABLE) ×6
HEMOSTAT SPONGE AVITENE ULTRA (HEMOSTASIS) IMPLANT
KIT BASIN OR (CUSTOM PROCEDURE TRAY) ×3 IMPLANT
KIT ROOM TURNOVER OR (KITS) ×3 IMPLANT
NS IRRIG 1000ML POUR BTL (IV SOLUTION) ×3 IMPLANT
PACK CV ACCESS (CUSTOM PROCEDURE TRAY) ×3 IMPLANT
PAD ARMBOARD 7.5X6 YLW CONV (MISCELLANEOUS) ×6 IMPLANT
SUT MNCRL AB 4-0 PS2 18 (SUTURE) ×3 IMPLANT
SUT PROLENE 6 0 BV (SUTURE) ×3 IMPLANT
SUT PROLENE 7 0 BV 1 (SUTURE) IMPLANT
SUT SILK 2 0 SH (SUTURE) IMPLANT
SUT VIC AB 2-0 CT1 27 (SUTURE)
SUT VIC AB 2-0 CT1 TAPERPNT 27 (SUTURE) IMPLANT
SUT VIC AB 3-0 SH 27 (SUTURE) ×2
SUT VIC AB 3-0 SH 27X BRD (SUTURE) ×1 IMPLANT
UNDERPAD 30X30 (UNDERPADS AND DIAPERS) ×3 IMPLANT
WATER STERILE IRR 1000ML POUR (IV SOLUTION) ×3 IMPLANT

## 2016-09-03 NOTE — Transfer of Care (Signed)
Immediate Anesthesia Transfer of Care Note  Patient: Devon [name]  Procedure(s) Performed: Procedure(s): FIRST STAGE LEFT BRACHIOCEPHALIC  VEIN TRANSPOSITION (Left)  Patient Location: PACU  Anesthesia Type:MAC  Level of Consciousness: awake, alert  and oriented  Airway & Oxygen Therapy: Patient Spontanous Breathing  Post-op Assessment: Report given to RN  Post vital signs: Reviewed and stable  Last Vitals:  Vitals:   09/03/16 0759 09/03/16 1000  BP: (!) 148/92 (!) 145/77  Pulse: 76 84  Resp: 18 16  Temp:  36.7 C    Last Pain: There were no vitals filed for this visit.       Complications: No apparent anesthesia complications

## 2016-09-03 NOTE — Interval H&P Note (Signed)
History and Physical Interval Note:  09/03/2016 7:42 AM  Devon [name]  has presented today for surgery, with the diagnosis of Stage V Chronic Kidney Disease N18.5  The various methods of treatment have been discussed with the patient and family. After consideration of risks, benefits and other options for treatment, the patient has consented to  Procedure(s): FIRST STAGE BRACHIAL VEIN TRANSPOSITION (Left) as a surgical intervention .  The patient's history has been reviewed, patient examined, no change in status, stable for surgery.  I have reviewed the patient's chart and labs.  Questions were answered to the patient's satisfaction.     Leonides SakeBrian Billijo Dilling

## 2016-09-03 NOTE — Op Note (Signed)
OPERATIVE NOTE   PROCEDURE: left brachiocephalic arteriovenous fistula placement  PRE-OPERATIVE DIAGNOSIS: chronic kidney disease stage V  POST-OPERATIVE DIAGNOSIS: same as above   SURGEON: Leonides SakeBrian Celesta Funderburk, MD  ASSISTANT(S): Doreatha MassedSamantha Rhyne, PAC   ANESTHESIA: local and MAC  ESTIMATED BLOOD LOSS: 50 cc  FINDING(S): 1.  Compressible large upper arm cephalic vein 2.  Palpable thrill and radial pulse at end of case  SPECIMEN(S):  none  INDICATIONS:   Devon [name] is a 50 y.o. male who presents with chronic kidney disease stage V.  The patient was scheduled for left staged brachial vein transposition.  The patient is aware the risks include but are not limited to: bleeding, infection, steal syndrome, nerve damage, ischemic monomelic neuropathy, failure to mature, and need for additional procedures.  The patient is aware of the risks of the procedure and elects to proceed forward.   DESCRIPTION: After full informed written consent was obtained from the patient, the patient was brought back to the operating room and placed supine upon the operating table.  Prior to induction, the patient received IV antibiotics.   After obtaining adequate anesthesia, the patient was then prepped and draped in the standard fashion for a left arm access procedure.  I turned my attention first to identifying the patient's brachial and cephalic vein and brachial artery.  The cephalic vein appeared to be substantially larger today than the brachial vein.  I interrogated the entire length of this vein in the upper arm.  It appeared compressible throughout.  I decided to proceed with a brachiocephalic fistula.  Using SonoSite guidance, the location of these vessels were marked out on the skin.     At this point, I injected local anesthetic to obtain a field block of the antecubitum.  In total, I injected about 5 mL of 1% lidocaine without epinephrine.  I made a transverse incision at the level of the antecubitum  and dissected through the subcutaneous tissue and fascia to gain exposure of the brachial artery.  This was noted to be 4-4.5 mm in diameter externally.  This was dissected out proximally and distally and controlled with vessel loops .  I then dissected out the cephalic vein.  This was noted to be 4 mm in diameter externally with minimal external sclerosis.  The distal segment of the vein was ligated with a  2-0 silk, and the vein was transected.  The proximal segment was interrogated with serial dilators.  The vein accepted up to a 4 mm dilator without any difficulty.  I then instilled the heparinized saline into the vein and clamped it.  At this point, I reset my exposure of the brachial artery and placed the artery under tension proximally and distally.  I made an arteriotomy with a #11 blade, and then I extended the arteriotomy with a Potts scissor.  I injected heparinized saline proximal and distal to this arteriotomy.  The vein was then sewn to the artery in an end-to-side configuration with a running stitch of 6-0 Prolene.  Prior to completing this anastomosis, I allowed the vein and artery to backbleed.  There was no evidence of clot from any vessels.  I completed the anastomosis in the usual fashion and then released all vessel loops and clamps.    There was apalpable  thrill in the venous outflow, and there was a palpable radial pulse.  At this point, I irrigated out the surgical wound.  There was no further active bleeding.  The subcutaneous tissue was reapproximated  with a running stitch of 3-0 Vicryl.  The skin was then reapproximated with a running subcuticular stitch of 4-0 Vicryl.  The skin was then cleaned, dried, and reinforced with LiquidBand.  The patient tolerated this procedure well.    COMPLICATIONS: none  CONDITION: stable   Leonides Sake, MD, [facility] [place] Office: [phone] Pager: [phone]  09/03/2016, 9:45 AM

## 2016-09-03 NOTE — Anesthesia Procedure Notes (Signed)
Procedure Name: MAC Date/Time: 09/03/2016 8:30 AM Performed by: De NurseENNIE, Calab Sachse E Pre-anesthesia Checklist: Patient identified, Emergency Drugs available, Suction available and Patient being monitored Patient Re-evaluated:Patient Re-evaluated prior to inductionOxygen Delivery Method: Simple face mask

## 2016-09-03 NOTE — H&P (View-Only) (Signed)
Referred by:  Devon LevinsJames W [name], MD [address] [place], [place] [id number]  Reason for referral: New access  History of Present Illness  Devon [name] is a 50 y.o. (1966/02/25) male who presents for evaluation for permanent access.  The patient is right hand dominant.  The patient has not had previous access procedures.  Previous central venous cannulation procedures include: none.  The patient has never had a PPM placed.   Past Medical History:  Diagnosis Date  . 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 Allergy   . Chronic kidney disease   . Hyperlipidemia   . Hypersomnolence 04/29/2012  . Hypertension   . OSA (obstructive sleep apnea) 04/29/2012  . Sarcoidosis (HCC)   . 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 corrective left eye surgery    . HERNIA REPAIR    . LIVER BIOPSY    . LUNG BIOPSY    . RENAL BIOPSY      Social History   Social History  . Marital status: Single    Spouse name: N/A  . Number of children: N/A  . Years of education: N/A   Occupational History  . Logistics Tech.    Social History Main Topics  . Smoking status: Current Every Day Smoker    Packs/day: 0.50    Years: 0.50    Types: Cigarettes    Last attempt to quit: 11/18/2007  . Smokeless tobacco: Never Used  . Alcohol use Yes     Comment: occasional social  . Drug use: No  . Sexual activity: Not on file   Other Topics Concern  . Not on file   Social History Narrative  . No narrative on file    Family History  Problem Relation Age of Onset  . Hypothyroidism Mother   . Cancer Father     Current Outpatient Prescriptions  Medication Sig Dispense Refill  . amLODipine (NORVASC) 10 MG tablet Take 1 tablet (10 mg total) by mouth daily. 90 tablet 3  . aspirin 81 MG EC tablet TAKE 1 TABLET BY MOUTH EVERY DAY 90 tablet 3  . hydrALAZINE (APRESOLINE) 50 MG tablet Take 1 tablet (50 mg total) by mouth 3  (three) times daily. (Patient taking differently: Take 100 mg by mouth 3 (three) times daily. ) 270 tablet 3  . hydrochlorothiazide (HYDRODIURIL) 25 MG tablet Take 1 tablet (25 mg total) by mouth daily. Yearly physical is due w/labs must see md for refills 90 tablet 3   No current facility-administered medications for this visit.     No Known Allergies  REVIEW OF SYSTEMS:   Cardiac:  positive for: no symptoms, negative for: Chest pain or chest pressure, Shortness of breath upon exertion and Shortness of breath when lying flat,   Vascular:  positive for: Leg swelling,  negative for: Pain in calf, thigh, or hip brought on by ambulation, Pain in feet at night that wakes you up from your sleep and Blood clot in your veins  Pulmonary:  positive for: no symptoms,  negative for: Oxygen at home, Productive cough and Wheezing  Neurologic:  positive for: No symptoms, negative for: Sudden weakness in arms or legs, Sudden numbness in arms or legs, Sudden onset of difficulty speaking or slurred speech, Temporary loss of vision in one eye and Problems with dizziness  Gastrointestinal:  positive for: no symptoms, negative for:  Blood in stool and Vomited blood  Genitourinary:  positive for: no symptoms, negative for: Burning when urinating and Blood in urine  Psychiatric:  positive for: no symptoms,  negative for: Major depression  Hematologic:  positive for: no symptoms,  negative for: negative for: Bleeding problems and Problems with blood clotting too easily  Dermatologic:  positive for: no symptoms, negative for: Rashes or ulcers  Constitutional:  positive for: no symptoms, negative for: Fever or chills  Ear/Nose/Throat:  positive for: no symptoms, negative for: Change in hearing, Nose bleeds and Sore throat  Musculoskeletal:  positive for: no symptoms, negative for: Back pain, Joint pain and Muscle pain   Physical Examination  Vitals:   08/22/16 0920 08/22/16 0926    BP: (!) 152/94 (!) 148/98  Pulse: 71   Resp: 20   Temp: 97.5 F (36.4 C)   TempSrc: Oral   SpO2: 99%   Weight: 274 lb 8 oz (124.5 kg)   Height: 6\' 5"  (1.956 m)     Body mass index is 32.55 kg/m.  General: Alert, O x 3, WD,NAD  Head: [place]/AT,   Ear/Nose/Throat: Hearing grossly intact, nares without erythema or drainage, oropharynx without Erythema or Exudate , Mallampati score: 3, Dentition intact  Eyes: PERRLA, EOMI,   Neck: Supple, mid-line trachea,    Pulmonary: Sym exp, good B air movt,CTA B  Cardiac: RRR, Nl S1, S2, no Murmurs, No rubs, No S3,S4  Vascular: Vessel Right Left  Radial Palpable Palpable  Brachial Palpable Palpable  Carotid Palpable, No Bruit Palpable, No Bruit  Aorta Not palpable N/A  Femoral Palpable Palpable  Popliteal Not palpable Not palpable  PT Palpable Palpable  DP Palpable Palpable   Gastrointestinal: soft, non-distended, non-tender to palpation, No guarding or rebound, no HSM, no masses, no CVAT B, No palpable prominent aortic pulse,    Musculoskeletal: M/S 5/5 throughout  , Extremities without ischemic changes  , No edema present,  , No LDS present  Neurologic: CN 2-12 intact , Pain and light touch intact in extremities , Motor exam as listed above  Psychiatric: Judgement intact, Mood & affect appropriate for pt's clinical situation  Dermatologic: See M/S exam for extremity exam, No rashes otherwise noted  Lymph : Palpable lymph nodes: None   Non-Invasive Vascular Imaging  Vein Mapping  (Date: 08/22/2016):   R arm: acceptable vein conduits include none  L arm: acceptable vein conduits include none  BUE Doppler (Date: 08/22/2016):   R arm:   Brachial: tri, 6.9 mm  Radial: tri, 3.2 mm  Ulnar: tri, 2.8 mm  L arm:   Brachial: tri, 6.3 mm  Radial: tri, 3.7 mm  Ulnar: tri, 2.4 mm    Outside Studies/Documentation 3 pages of outside documents were reviewed including: nephrology chart.   Medical Decision  Making  [name] is a 50 y.o. male who presents with chronic kidney disease stage V   Based on vein mapping and examination, this patient's permanent access options include: L staged BRVT, R staged BRVT.  I would start with L staged BRVT  I had an extensive discussion with this patient in regards to the nature of access surgery, including risk, benefits, and alternatives.    The patient is aware that the risks of access surgery include but are not limited to: bleeding, infection, steal syndrome, nerve damage, ischemic monomelic neuropathy, failure of access to mature, and possible need for additional access procedures in the future. I discussed with the patient the nature of the staged access procedure,  specifically the need for a second operation to transpose the first stage fistula if it matures adequately.   The patient has NOT agreed to proceed with the above procedure.  He will contact us when he is ready to proceed.   Leonides Sake, MD [facility] [place] Office: [phone] Pager: [phone]  08/22/2016, 10:18 AM

## 2016-09-03 NOTE — Discharge Instructions (Signed)
° ° °  09/03/2016 Devon [name] [id number] 04/16/1966  Surgeon(s): Fransisco HertzBrian L Chen, MD  Procedure(s): Left Brachial Cephalic AV Fistula  x Do not stick fistula for 12 weeks

## 2016-09-03 NOTE — Anesthesia Postprocedure Evaluation (Signed)
Anesthesia Post Note  Patient: Devon [name]  Procedure(s) Performed: Procedure(s) (LRB): FIRST STAGE LEFT BRACHIOCEPHALIC  VEIN TRANSPOSITION (Left)  Patient location during evaluation: PACU Anesthesia Type: MAC Level of consciousness: awake and alert Pain management: pain level controlled Vital Signs Assessment: post-procedure vital signs reviewed and stable Respiratory status: spontaneous breathing, nonlabored ventilation, respiratory function stable and patient connected to nasal cannula oxygen Cardiovascular status: stable and blood pressure returned to baseline Anesthetic complications: no    Last Vitals:  Vitals:   09/03/16 1015 09/03/16 1026  BP: 113/77 (!) 158/91  Pulse: 79 80  Resp: 19 18  Temp:      Last Pain: There were no vitals filed for this visit.               Vlasta Baskin Astrid DivineS Aleph Nickson

## 2016-09-04 ENCOUNTER — Telehealth: Payer: Self-pay | Admitting: Vascular Surgery

## 2016-09-04 ENCOUNTER — Encounter (HOSPITAL_COMMUNITY): Payer: Self-pay | Admitting: Vascular Surgery

## 2016-09-04 NOTE — Telephone Encounter (Signed)
LVM on home #, mailing letter for appt on 12/1

## 2016-09-04 NOTE — Telephone Encounter (Signed)
-----   Message from Sharee PimpleMarilyn K McChesney, RN sent at 09/03/2016 12:23 PM EDT ----- Regarding: schedule   ----- Message ----- From: Fransisco HertzBrian L Chen, MD Sent: 09/03/2016   9:49 AM To: [facility] [address]  Ellamae SiaRedondo [place]ara [id number] [birth year]  PROCEDURE: left brachiocephalic arteriovenous fistula placement  Asst: Doreatha MassedSamantha Rhyne, PAC   Follow-up: 6 weeks

## 2016-09-18 ENCOUNTER — Encounter: Payer: Self-pay | Admitting: *Deleted

## 2016-10-15 ENCOUNTER — Encounter: Payer: Self-pay | Admitting: Vascular Surgery

## 2016-10-16 NOTE — Progress Notes (Signed)
    Postoperative Access Visit   History of Present Illness  Ellamae SiaRedondo Mcgurk is a 50 y.o. year old male who presents for postoperative follow-up for: L BC AVF (Date: 09/03/16).  The patient's wounds are healed.  The patient notes no steal symptoms.  The patient is able to complete their activities of daily living.  The patient's current symptoms are: none.  For VQI Use Only  PRE-ADM LIVING: Home  AMB STATUS: Ambulatory  Physical Examination Vitals:   10/17/16 1224  BP: (!) 145/82  Pulse: 83  Resp: 18  Temp: 99 F (37.2 C)    LUEE: Incision is healed, skin feels warm, hand grip is 5/5, sensation in digits is intact, palpable thrill, bruit can be auscultated, visible fistula, on Sonosite > 8 mm throughout   Medical Decision Making  Ellamae SiaRedondo Borre is a 50 y.o. year old male who presents s/p L BC AVF.   The patient's access is ready for use.  Thank you for allowing us to participate in this patient's care.  Leonides SakeBrian Voncille Simm, MD, FACS [facility] Office: [phone] Pager: [phone]

## 2016-10-17 ENCOUNTER — Telehealth: Payer: Self-pay | Admitting: Internal Medicine

## 2016-10-17 ENCOUNTER — Encounter: Payer: Self-pay | Admitting: Vascular Surgery

## 2016-10-17 ENCOUNTER — Ambulatory Visit (INDEPENDENT_AMBULATORY_CARE_PROVIDER_SITE_OTHER): Payer: Self-pay | Admitting: Vascular Surgery

## 2016-10-17 VITALS — BP 145/82 | HR 83 | Temp 99.0°F | Resp 18 | Ht 77.0 in | Wt 280.0 lb

## 2016-10-17 DIAGNOSIS — D869 Sarcoidosis, unspecified: Secondary | ICD-10-CM

## 2016-10-17 DIAGNOSIS — N185 Chronic kidney disease, stage 5: Secondary | ICD-10-CM

## 2016-10-17 NOTE — Telephone Encounter (Signed)
Rheumatology does not usually treat kidney failure or sarcoidosis.  I can refer to pulmonary if he likes

## 2016-10-17 NOTE — Telephone Encounter (Signed)
Patient is requesting a provider referral to rheumatology. He is in kidney failure/ sarcoidosis.   Francee GentileAldona Ziolkowska, MD Rheumatology   Intern[place] in [place] Address: [address], [place], [place] [address] Phone: [phone]

## 2016-10-20 ENCOUNTER — Encounter: Payer: Self-pay | Admitting: Nephrology

## 2016-10-22 NOTE — Telephone Encounter (Signed)
Patient called back to check on referral.  He never received call back with MD response.  Patient does want referral to pulmonary.  He is a little concerned by being in kidney failure and wants to be seen ASAP.  Patient states he is self pay.  Can we please mark as urgent referral?  Thanks

## 2016-10-22 NOTE — Telephone Encounter (Signed)
Sorry about the perception of the late response, as my initial impression was this was not an emergency matter   Ok for referral to pulmonary

## 2016-10-23 NOTE — Telephone Encounter (Signed)
Called patient unable to reach left message regarding referral

## 2016-10-24 DIAGNOSIS — Z0279 Encounter for issue of other medical certificate: Secondary | ICD-10-CM | POA: Diagnosis not present

## 2016-10-29 ENCOUNTER — Encounter: Payer: Self-pay | Admitting: Internal Medicine

## 2016-10-29 ENCOUNTER — Ambulatory Visit: Payer: Self-pay | Admitting: Internal Medicine

## 2016-11-27 ENCOUNTER — Telehealth: Payer: Self-pay | Admitting: Pulmonary Disease

## 2016-11-27 ENCOUNTER — Institutional Professional Consult (permissible substitution): Payer: Self-pay | Admitting: Pulmonary Disease

## 2016-11-27 NOTE — Telephone Encounter (Signed)
EKG 09/03/16 (personally reviewed by me): Sinus rhythm with first-degree AV block and incomplete right bundle branch block. QTC 442 ms.  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  LABS 07/11/16 CBC: 6.2/11.5/35.2/225  04/29/16 BMP: 140/4.6/105/27/41/4.3/86/9.0 LFT: 3.4/5.9/0.5/64/23/17 CBC: 6.3/12.8/38.2/273

## 2017-01-09 ENCOUNTER — Encounter: Payer: Self-pay | Admitting: Pulmonary Disease

## 2017-01-09 ENCOUNTER — Ambulatory Visit (INDEPENDENT_AMBULATORY_CARE_PROVIDER_SITE_OTHER)
Admission: RE | Admit: 2017-01-09 | Discharge: 2017-01-09 | Disposition: A | Discharge status: Home / Self Care | Payer: Medicaid Other | Source: Ambulatory Visit | Attending: Pulmonary Disease | Admitting: Pulmonary Disease

## 2017-01-09 ENCOUNTER — Ambulatory Visit (INDEPENDENT_AMBULATORY_CARE_PROVIDER_SITE_OTHER): Payer: Medicaid Other | Admitting: Pulmonary Disease

## 2017-01-09 VITALS — BP 150/78 | HR 84 | Ht 77.0 in | Wt 286.0 lb

## 2017-01-09 DIAGNOSIS — D869 Sarcoidosis, unspecified: Secondary | ICD-10-CM | POA: Diagnosis not present

## 2017-01-09 NOTE — Assessment & Plan Note (Signed)
Mr. Devon [name] has a diagnosis of sarcoidosis based on a liver biopsy in the year 2000 which showed multinucleated giant cells, some fibrosis. He also had a transbronchial biopsy which showed slight fibrosis" inflammatory change but no granulomas or neoplasia.  We will check a lung function test to see if there is any evidence of active pulmonary disease but I doubt it based on his lack of symptoms. Given the length of time since his original diagnosis I believe that his disease is quiescent.  Plan: Pulmonary function test Annual optometry visits, he was counseled on the importance of telling him that he has a diagnosis of sarcoidosis Chest x-ray Follow-up one year or sooner if needed

## 2017-01-09 NOTE — Progress Notes (Signed)
Subjective:    Patient ID: Devon [name], male    DOB: 10-04-1966, 51 y.o.   MRN: [id number]  HPI Chief Complaint  Patient presents with  . Advice Only    Referred back to clinic by Dr. Jonny Ruiz for sarcoidosis.  Pt previously saw [facility] in 2013 for OSA.      Devon [name] is here to establish care for sarcoidosis.  He was diagnosed with sarcoid in [place] after several biopsies (kidney, liver and lung).  He was diagnosed as a young adult, he thinks early 20's.  He says that one of his biopsies proved this.  No respiratory symptoms ever.  He was treated with prednisone but he doesn't know what the focus of therapy was.   He caries a diagnosis of obstructive sleep panea, but he says that this was only a mild finding when supine.  He says that he was prescribed CPAP but he doesn't use it much.  He says that he hasn't been back to his baseline energy level, but he thinks this is related to all his medical issues and not.    He has been followed by opthalmology extensively for years, but ths has been OK for many years now and is stable.  He has no visual complaints.  He had no childhood respiratory illnesses.  He smoked 1/4 ppd for many, quit 07/2016.    He currently is dealing with chronic kidney disease.     Past Medical History:  Diagnosis Date  . 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 Allergy   . Chronic kidney disease   . Hyperlipidemia   . Hypersomnolence 04/29/2012  . Hypertension   . OSA (obstructive sleep apnea) 04/29/2012   no cpap  . Pneumonia   . Sarcoidosis (HCC)   . 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 Hypothyroidism Mother   . Lung cancer Father      Social History   Social History  . Marital status: Single    Spouse name: N/A  . Number of children: N/A  . Years of education: N/A   Occupational History  . Logistics Tech.    Social History Main Topics  . Smoking status: Former Smoker      Packs/day: 0.50    Years: 0.50    Types: Cigarettes    Quit date: 07/18/2016  . Smokeless tobacco: Never Used  . Alcohol use Yes     Comment: occasional social  . Drug use: No  . Sexual activity: Not on file   Other Topics Concern  . Not on file   Social History Narrative  . No narrative on file     Allergies  Allergen Reactions  . No Known Allergies      Outpatient Medications Prior to Visit  Medication Sig Dispense Refill  . amLODipine (NORVASC) 10 MG tablet Take 1 tablet (10 mg total) by mouth daily. 90 tablet 3  . aspirin 81 MG EC tablet TAKE 1 TABLET BY MOUTH EVERY DAY (Patient taking differently: Take 81 mg by mouth every day) 90 tablet 3  . hydrALAZINE (APRESOLINE) 50 MG tablet Take 2 tablets (100 mg total) by mouth 3 (three) times daily. (Patient taking differently: Take 100 mg by mouth 3 (three) times daily. Takes 100 mg tablet three times daily)    . hydrochlorothiazide (HYDRODIURIL) 25 MG tablet Take 1 tablet (25 mg total) by mouth  daily. Yearly physical is due w/labs must see md for refills 90 tablet 3  . oxyCODONE-acetaminophen (PERCOCET) 5-325 MG tablet Take 1 tablet by mouth every 6 (six) hours as needed for severe pain. (Patient not taking: Reported on 10/17/2016) 8 tablet 0   No facility-administered medications prior to visit.       Review of Systems  Constitutional: Negative for fever and unexpected weight change.  HENT: Negative for congestion, dental problem, ear pain, nosebleeds, postnasal drip, rhinorrhea, sinus pressure, sneezing, sore throat and trouble swallowing.   Eyes: Negative for redness and itching.  Respiratory: Negative for cough, chest tightness, shortness of breath and wheezing.   Cardiovascular: Negative for palpitations and leg swelling.  Gastrointestinal: Negative for nausea and vomiting.  Genitourinary: Negative for dysuria.  Musculoskeletal: Negative for joint swelling.  Skin: Negative for rash.  Neurological: Negative for  headaches.  Hematological: Does not bruise/bleed easily.  Psychiatric/Behavioral: Negative for dysphoric mood. The patient is not nervous/anxious.        Objective:   Physical Exam Vitals:   01/09/17 1342  BP: (!) 150/78  Pulse: 84  SpO2: 97%  Weight: 286 lb (129.7 kg)  Height: 6\' 5"  (1.956 m)  RA  Gen: well appearing, no acute distress HENT: NCAT, OP clear, neck supple without masses Eyes: PERRL, EOMi Lymph: no cervical lymphadenopathy PULM: CTA B CV: RRR, no mgr, no JVD GI: BS+, soft, nontender, no hsm Derm: no rash or skin breakdown MSK: normal bulk and tone Neuro: A&Ox4, CN II-XII intact, strength 5/5 in all 4 extremities Psyche: normal mood and affect  Review of records from his pulmonologist in [place] shows that he had normal lung function testing, was followed annually for sarcoidosis. It appears that the majority of the symptoms he had there were related to hypertension. He was noted to have liver involvement of his sarcoidosis based on his CT scan. Notes indicate that he also had a history of "glandular involvement and neurosarcoidosis but there is no details on this. He was treated with prednisone as recent as the year 2000. He also indicate that he had gastroesophageal reflux disease. He had a bronchoscopy with lung biopsy in the year 2000.  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 He also had a transbronchial biopsy which showed slight fibrosis" inflammatory change but no granulomas or  neoplasia.  We will check a lung function test to see if there is any evidence of active pulmonary disease but I doubt it based on his lack of symptoms. Given the length of time since his original diagnosis I believe that his disease is quiescent.  Plan: Pulmonary function test Annual optometry visits, he was counseled on the importance of telling him that he has a diagnosis of sarcoidosis Chest x-ray Follow-up one year or sooner if needed    Current Outpatient Prescriptions:  .  amLODipine (NORVASC) 10 MG tablet, Take 1 tablet (10 mg total) by mouth daily., Disp: 90 tablet, Rfl: 3 .  aspirin 81 MG EC tablet, TAKE 1 TABLET BY MOUTH EVERY DAY (Patient  taking differently: Take 81 mg by mouth every day), Disp: 90 tablet, Rfl: 3 .  hydrALAZINE (APRESOLINE) 50 MG tablet, Take 2 tablets (100 mg total) by mouth 3 (three) times daily. (Patient taking differently: Take 100 mg by mouth 3 (three) times daily. Takes 100 mg tablet three times daily), Disp: , Rfl:  .  hydrochlorothiazide (HYDRODIURIL) 25 MG tablet, Take 1 tablet (25 mg total) by mouth daily. Yearly physical is due w/labs must see md for refills, Disp: 90 tablet, Rfl: 3

## 2017-01-09 NOTE — Patient Instructions (Signed)
We will arrange for a lung function test and a chest x-ray and then call you with those results We will see you back in one year or sooner if needed

## 2017-01-15 LAB — PULMONARY FUNCTION TEST

## 2017-01-27 ENCOUNTER — Ambulatory Visit (HOSPITAL_COMMUNITY): Payer: Medicaid Other | Attending: Pulmonary Disease

## 2017-07-28 IMAGING — US US SOFT TISSUE HEAD/NECK
1 series · 14 of 25 positions shown · non-contrast
Comparison: None.

CLINICAL DATA: Multiple thyroid nodules.

EXAM:
THYROID ULTRASOUND
TECHNIQUE: Ultrasound examination of the thyroid gland and adjacent soft
tissues was performed.

[Series 1: us soft tissue head/neck · 0.07mm/px · 14 of 77 slices shown]
[im 1/77]
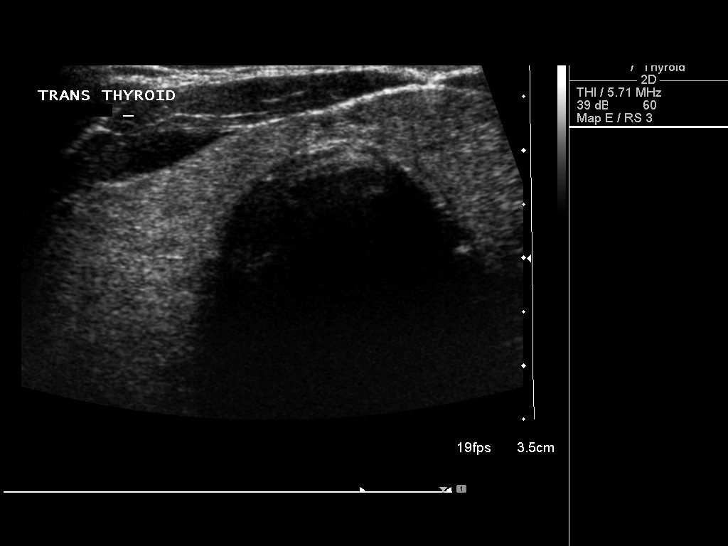
[im 7/77]
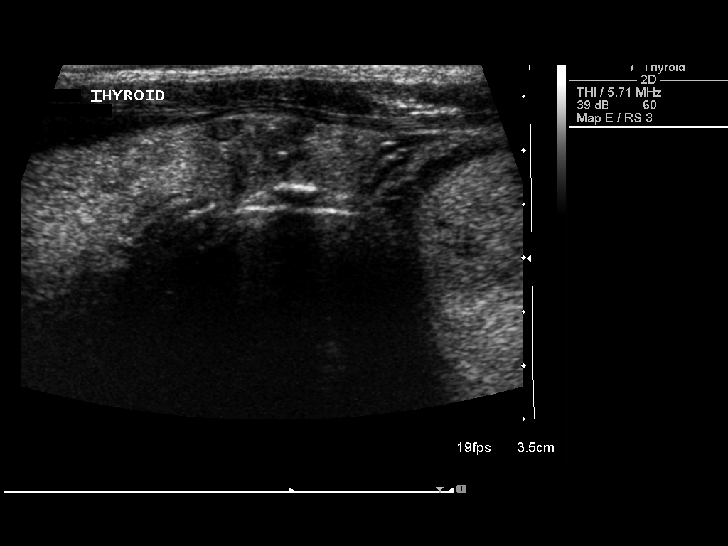
[im 13/77]
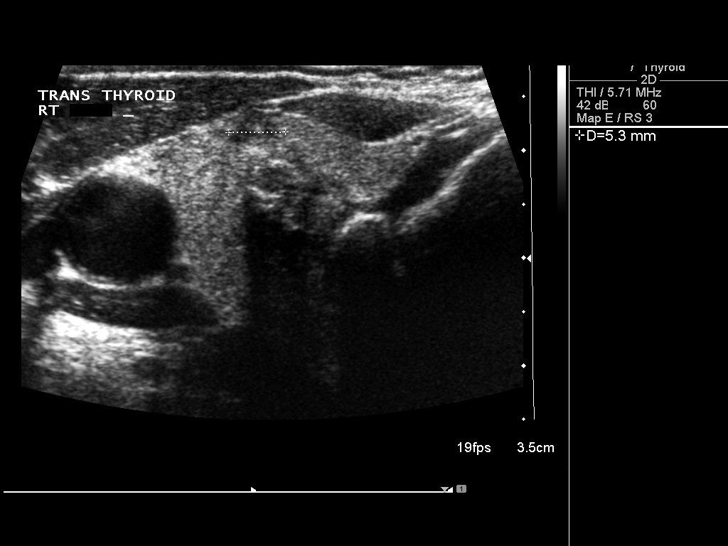
[im 20/77]
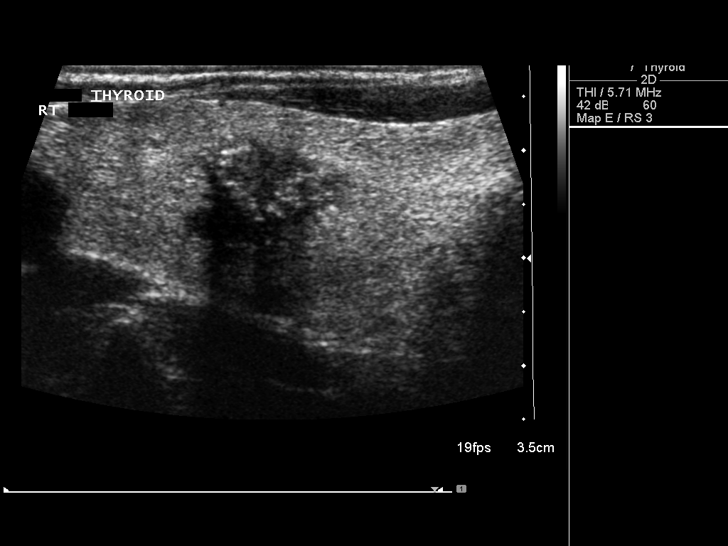
[im 26/77]
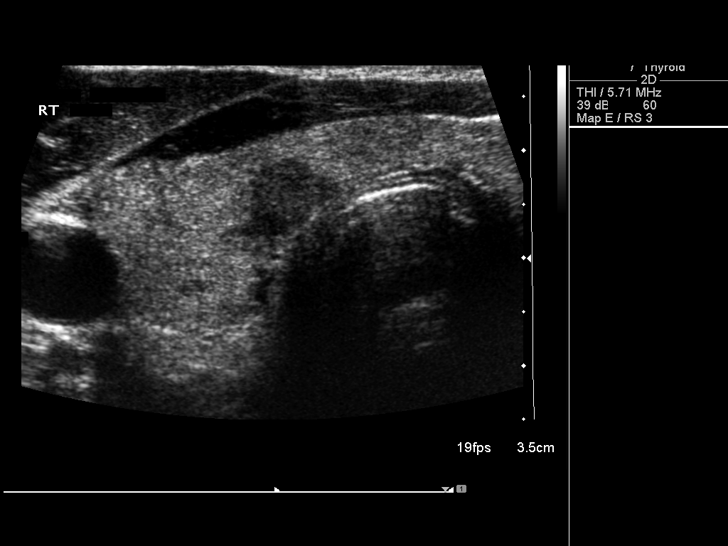
[im 29/77]
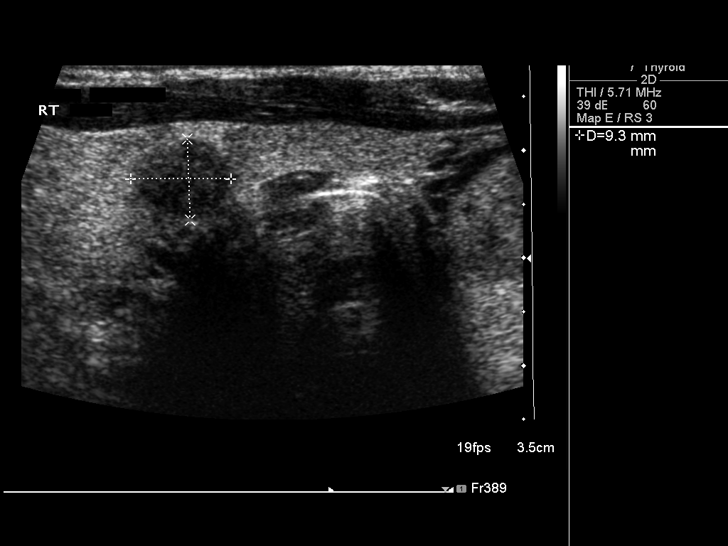
[im 35/77]
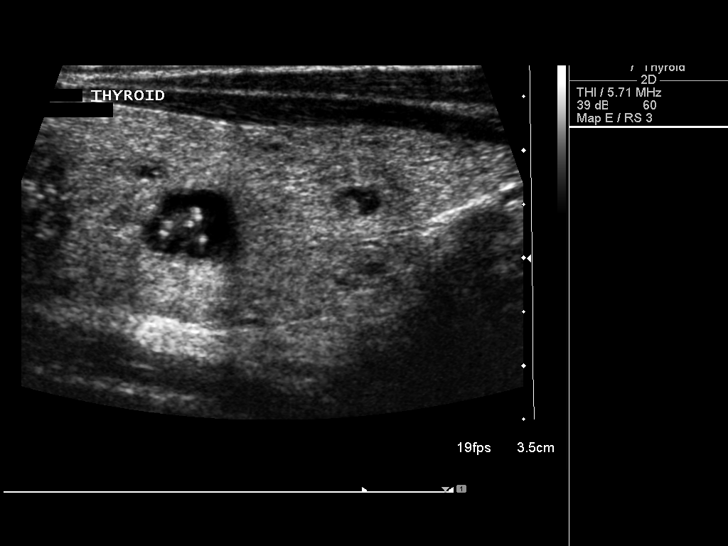
[im 42/77]
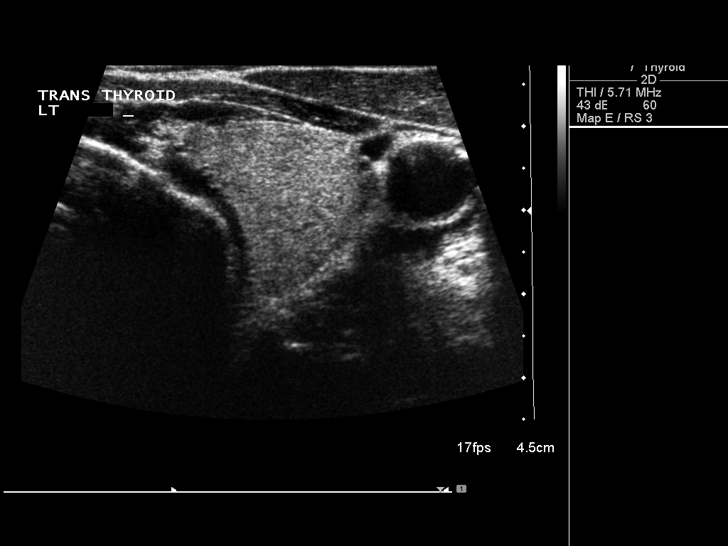
[im 48/77]
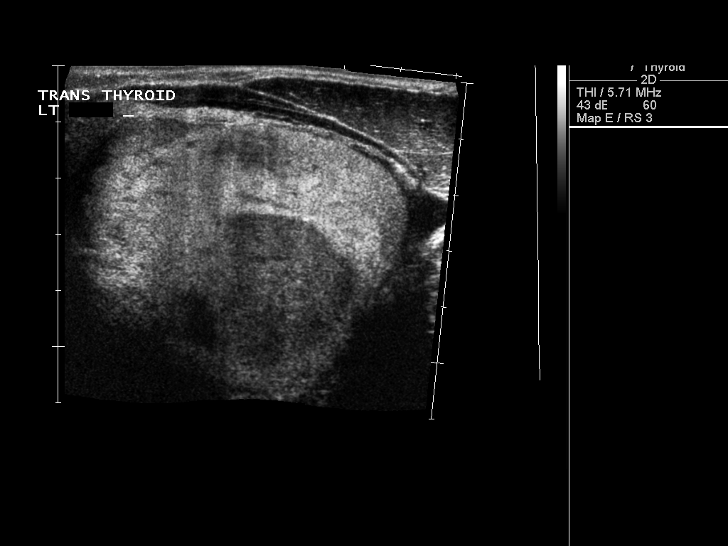
[im 51/77]
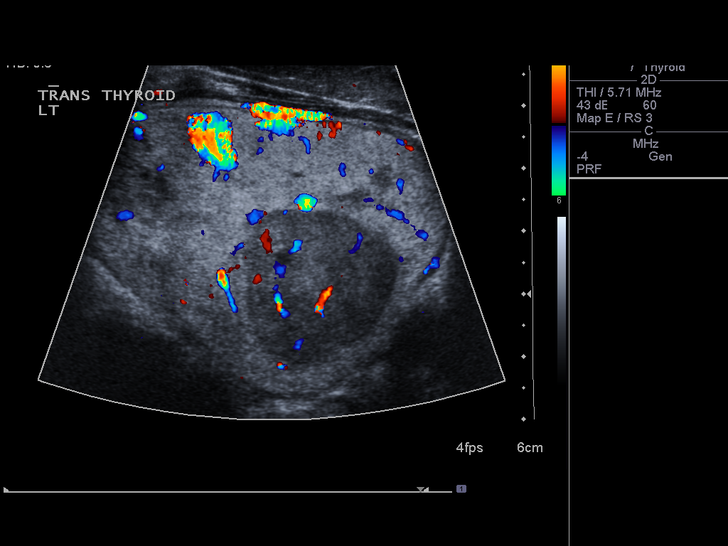
[im 58/77]
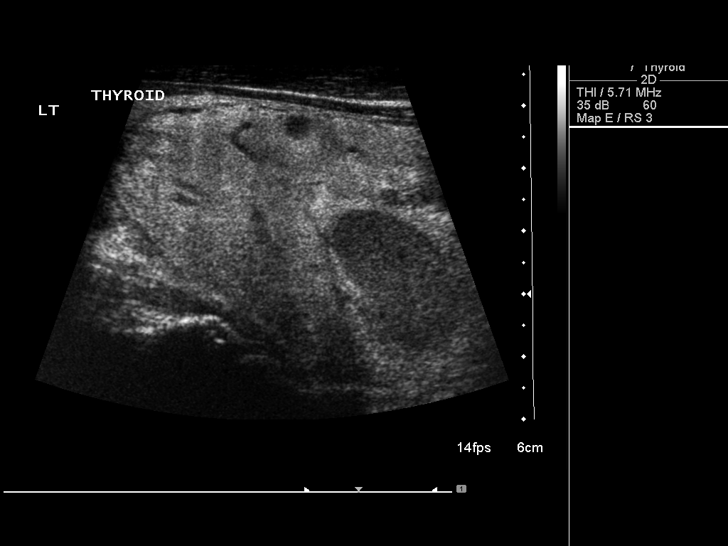
[im 64/77]
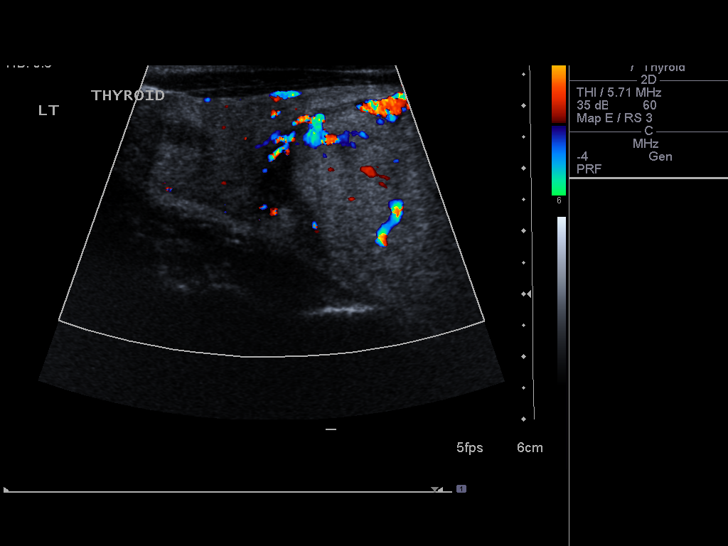
[im 70/77]
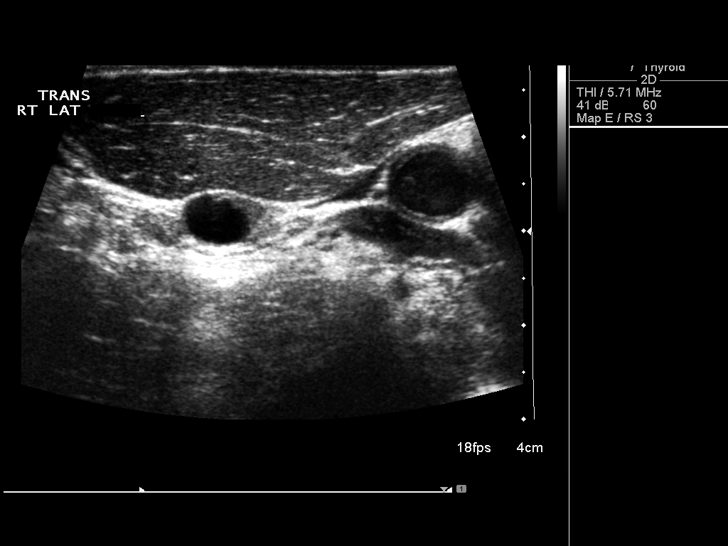
[im 77/77]
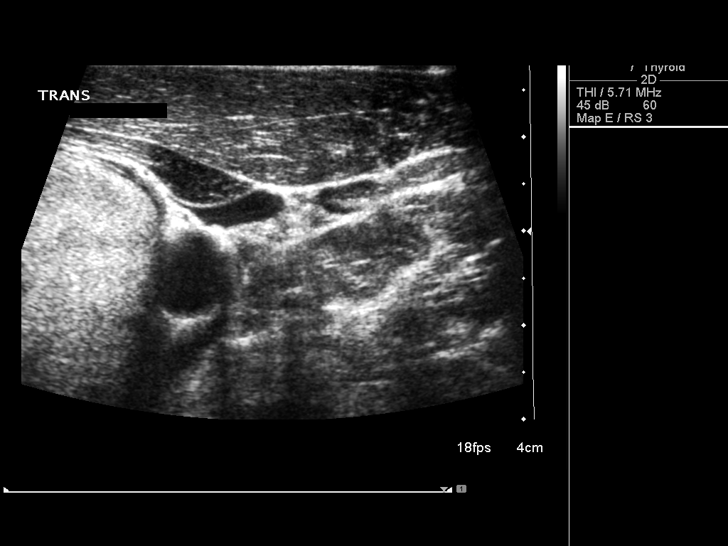

[14 of 25 positions shown; findings below may reference images not displayed]

FINDINGS: Right thyroid lobe

Measurements: 7.1 x 2.6 x 2.0 cm. Multiple nodules are noted, with
the largest measuring 1.4 cm in midpole with coarse calcifications.
9 x 8 x 8 solid nodule is noted inferiorly.

Left thyroid lobe

Measurements: 9.6 x 6.1 x 4.4 cm. Solid nodule measuring 7.1 x 6.1 x
4.3 cm is seen in the midpole and lower pole.

Isthmus

Thickness: 5 mm.  No nodules visualized.

Lymphadenopathy

Small lymph nodes are noted bilaterally, all of which are less than
8 mm in minor axis.
IMPRESSION: Bilateral thyroid nodules are noted, with the largest being 7.1 cm
solid nodule in the midpole and lower pole of left thyroid lobe.
Findings meet consensus criteria for biopsy. Ultrasound-guided fine
needle aspiration should be considered, as per the consensus
statement: Management of Thyroid Nodules Detected at US: Society of
Radiologists in Ultrasound Consensus Conference Statement. Radiology

## 2017-08-26 IMAGING — US US THYROID BIOPSY
1 series · 6 of 6 positions shown · non-contrast
Comparison: none

INDICATION: Large left thyroid mass

[Series 1: us thyroid biopsy · 6 acquisitions, 6 frames shown]
[im 1/6]
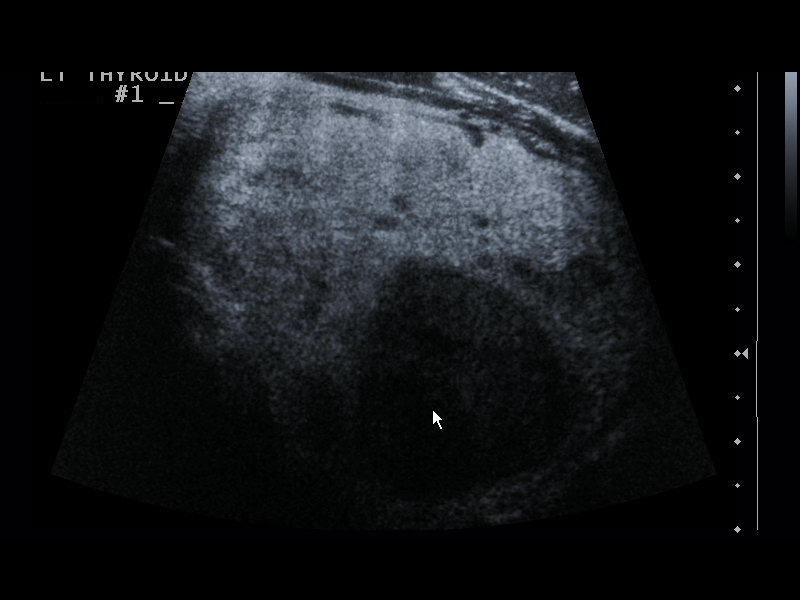
[im 2/6]
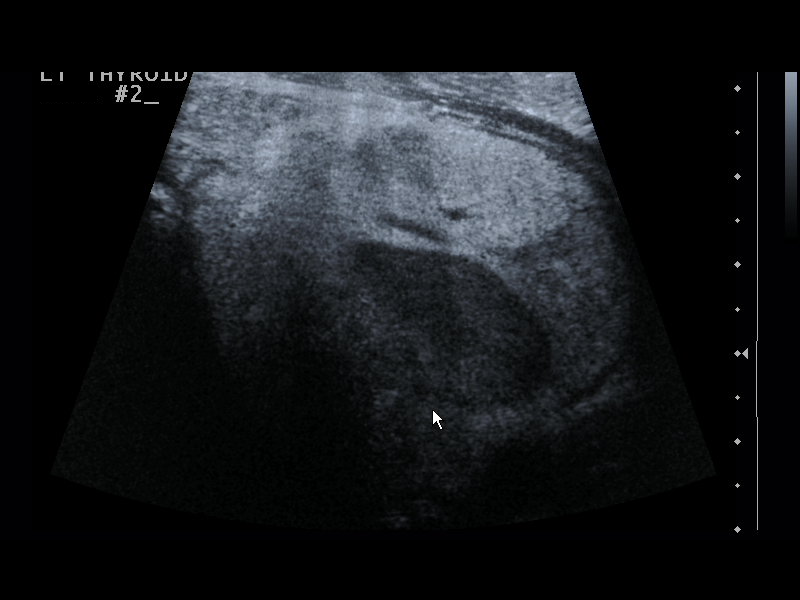
[im 3/6]
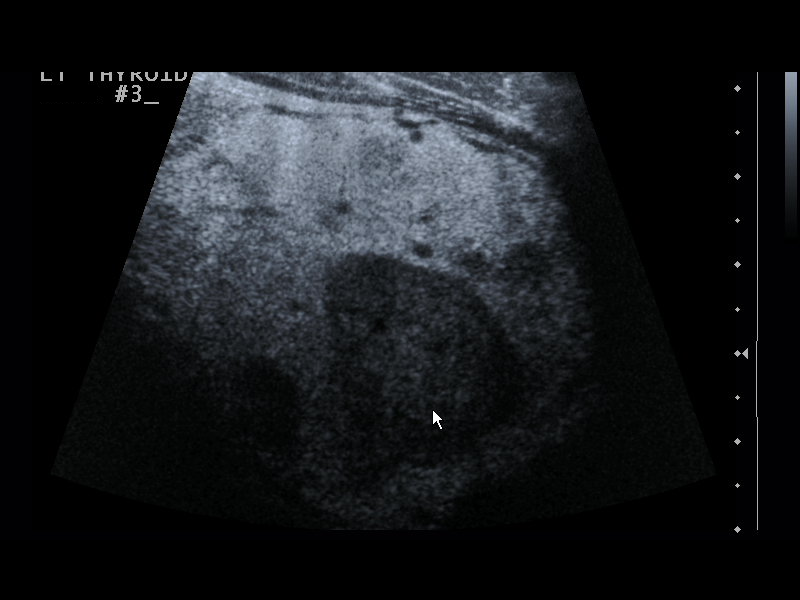
[im 4/6]
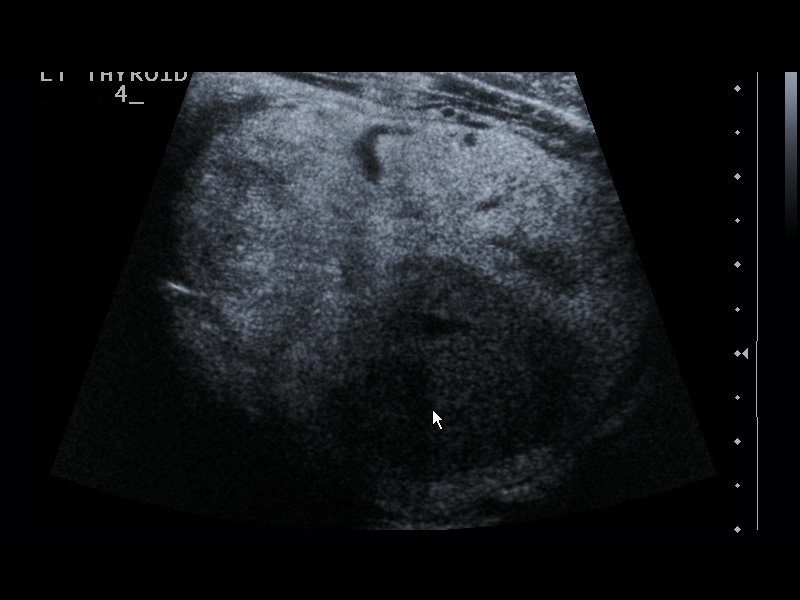
[im 5/6]
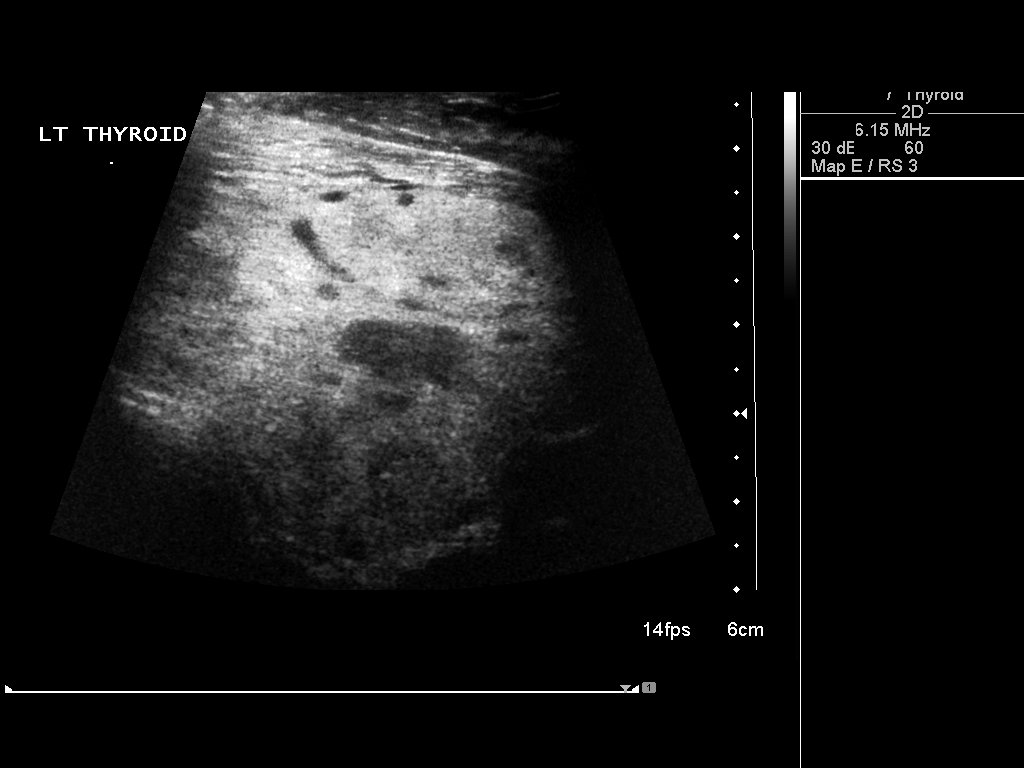
[im 6/6]
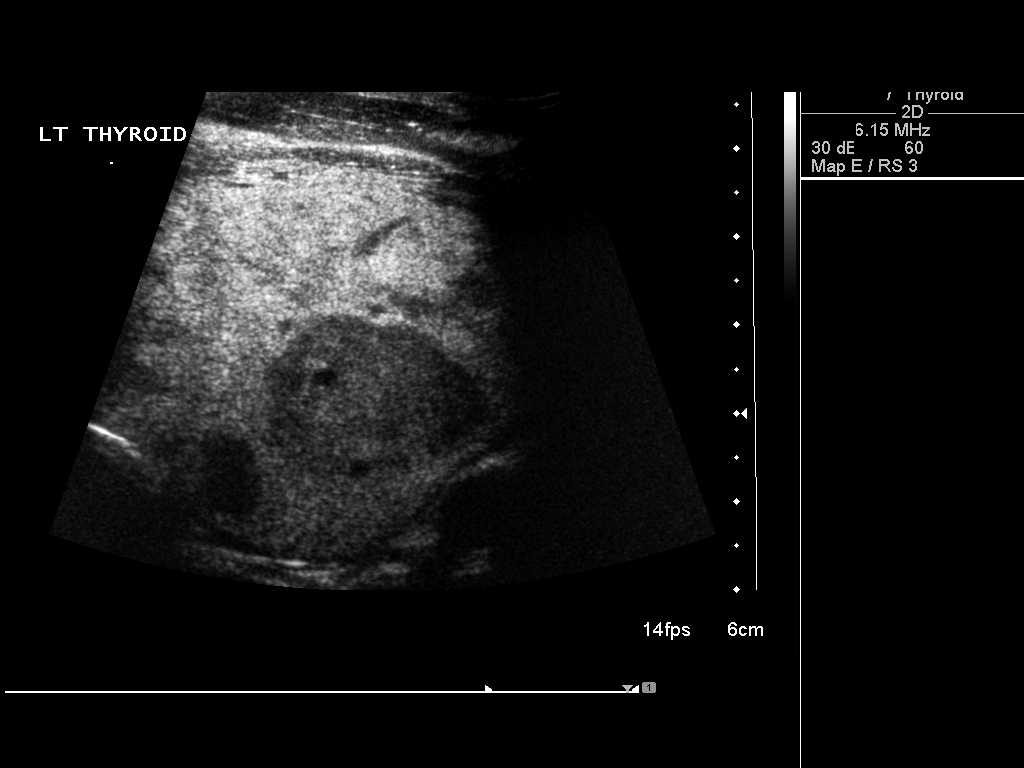

[6 of 6 positions shown; findings below may reference images not displayed]

EXAM:
ULTRASOUND GUIDED NEEDLE ASPIRATE BIOPSY OF THE THYROID GLAND

MEDICATIONS:
None.

ANESTHESIA/SEDATION:
None.

FLUOROSCOPY TIME:  None.

COMPLICATIONS:
None immediate.

PROCEDURE:
Thyroid biopsy was thoroughly discussed with the patient and
questions were answered. The benefits, risks, alternatives, and
complications were also discussed. The patient understands and
wishes to proceed with the procedure. Written consent was obtained.

Ultrasound was performed to localize and mark an adequate site for
the biopsy. The patient was then prepped and draped in a normal
sterile fashion. Local anesthesia was provided with 1% lidocaine.
Using direct ultrasound guidance, 4 passes were made using needles
into the nodule within the left lobe of the thyroid. Ultrasound was
used to confirm needle placements on all occasions. Specimens were
sent to Pathology for analysis.
IMPRESSION: Ultrasound guided needle aspirate biopsy performed of the left
thyroid nodule.

## 2017-09-08 DIAGNOSIS — R0981 Nasal congestion: Secondary | ICD-10-CM | POA: Insufficient documentation

## 2017-09-08 DIAGNOSIS — E041 Nontoxic single thyroid nodule: Secondary | ICD-10-CM | POA: Insufficient documentation

## 2017-09-08 DIAGNOSIS — Z9109 Other allergy status, other than to drugs and biological substances: Secondary | ICD-10-CM | POA: Insufficient documentation

## 2017-09-10 DIAGNOSIS — R011 Cardiac murmur, unspecified: Secondary | ICD-10-CM | POA: Insufficient documentation

## 2017-10-14 ENCOUNTER — Inpatient Hospital Stay (HOSPITAL_COMMUNITY)
Admission: AD | Admit: 2017-10-14 | Discharge: 2017-10-20 | DRG: 682 | Disposition: A | Discharge status: Home / Self Care | Payer: Medicaid Other | Source: Other Acute Inpatient Hospital | Attending: Internal Medicine | Admitting: Internal Medicine

## 2017-10-14 ENCOUNTER — Other Ambulatory Visit: Payer: Self-pay

## 2017-10-14 ENCOUNTER — Encounter (HOSPITAL_COMMUNITY): Payer: Self-pay | Admitting: General Practice

## 2017-10-14 ENCOUNTER — Inpatient Hospital Stay (HOSPITAL_COMMUNITY): Payer: Medicaid Other

## 2017-10-14 DIAGNOSIS — Z7951 Long term (current) use of inhaled steroids: Secondary | ICD-10-CM | POA: Diagnosis not present

## 2017-10-14 DIAGNOSIS — D631 Anemia in chronic kidney disease: Secondary | ICD-10-CM | POA: Diagnosis present

## 2017-10-14 DIAGNOSIS — N186 End stage renal disease: Secondary | ICD-10-CM | POA: Diagnosis present

## 2017-10-14 DIAGNOSIS — N185 Chronic kidney disease, stage 5: Secondary | ICD-10-CM | POA: Diagnosis present

## 2017-10-14 DIAGNOSIS — R0600 Dyspnea, unspecified: Secondary | ICD-10-CM | POA: Diagnosis present

## 2017-10-14 DIAGNOSIS — R011 Cardiac murmur, unspecified: Secondary | ICD-10-CM | POA: Diagnosis present

## 2017-10-14 DIAGNOSIS — G839 Paralytic syndrome, unspecified: Secondary | ICD-10-CM | POA: Diagnosis present

## 2017-10-14 DIAGNOSIS — Z23 Encounter for immunization: Secondary | ICD-10-CM | POA: Diagnosis not present

## 2017-10-14 DIAGNOSIS — D869 Sarcoidosis, unspecified: Secondary | ICD-10-CM | POA: Diagnosis present

## 2017-10-14 DIAGNOSIS — I10 Essential (primary) hypertension: Secondary | ICD-10-CM | POA: Diagnosis present

## 2017-10-14 DIAGNOSIS — I120 Hypertensive chronic kidney disease with stage 5 chronic kidney disease or end stage renal disease: Secondary | ICD-10-CM | POA: Diagnosis present

## 2017-10-14 DIAGNOSIS — G4733 Obstructive sleep apnea (adult) (pediatric): Secondary | ICD-10-CM | POA: Diagnosis present

## 2017-10-14 DIAGNOSIS — Z79899 Other long term (current) drug therapy: Secondary | ICD-10-CM

## 2017-10-14 DIAGNOSIS — N183 Chronic kidney disease, stage 3 unspecified: Secondary | ICD-10-CM | POA: Diagnosis present

## 2017-10-14 DIAGNOSIS — E785 Hyperlipidemia, unspecified: Secondary | ICD-10-CM | POA: Diagnosis present

## 2017-10-14 DIAGNOSIS — R112 Nausea with vomiting, unspecified: Secondary | ICD-10-CM | POA: Diagnosis present

## 2017-10-14 DIAGNOSIS — Z87891 Personal history of nicotine dependence: Secondary | ICD-10-CM

## 2017-10-14 DIAGNOSIS — Z7982 Long term (current) use of aspirin: Secondary | ICD-10-CM

## 2017-10-14 DIAGNOSIS — R110 Nausea: Secondary | ICD-10-CM | POA: Diagnosis present

## 2017-10-14 DIAGNOSIS — R0602 Shortness of breath: Secondary | ICD-10-CM

## 2017-10-14 DIAGNOSIS — Z992 Dependence on renal dialysis: Secondary | ICD-10-CM | POA: Diagnosis not present

## 2017-10-14 DIAGNOSIS — N19 Unspecified kidney failure: Secondary | ICD-10-CM | POA: Diagnosis present

## 2017-10-14 DIAGNOSIS — R682 Dry mouth, unspecified: Secondary | ICD-10-CM | POA: Diagnosis present

## 2017-10-14 HISTORY — DX: Other seasonal allergic rhinitis: J30.2

## 2017-10-14 HISTORY — DX: Cardiac murmur, unspecified: R01.1

## 2017-10-14 HISTORY — DX: Chronic kidney disease, stage 5: N18.5

## 2017-10-14 LAB — CBC
HCT: 23.8 % — ABNORMAL LOW (ref 39.0–52.0)
HEMOGLOBIN: 7.7 g/dL — AB (ref 13.0–17.0)
MCH: 27 pg (ref 26.0–34.0)
MCHC: 32.4 g/dL (ref 30.0–36.0)
MCV: 83.5 fL (ref 78.0–100.0)
Platelets: 304 10*3/uL (ref 150–400)
RBC: 2.85 MIL/uL — AB (ref 4.22–5.81)
RDW: 14.6 % (ref 11.5–15.5)
WBC: 7.3 10*3/uL (ref 4.0–10.5)

## 2017-10-14 LAB — TYPE AND SCREEN
ABO/RH(D): B POS
ANTIBODY SCREEN: NEGATIVE

## 2017-10-14 LAB — COMPREHENSIVE METABOLIC PANEL
ALBUMIN: 2.6 g/dL — AB (ref 3.5–5.0)
ALT: 16 U/L — ABNORMAL LOW (ref 17–63)
ANION GAP: 12 (ref 5–15)
AST: 17 U/L (ref 15–41)
Alkaline Phosphatase: 63 U/L (ref 38–126)
BILIRUBIN TOTAL: 0.4 mg/dL (ref 0.3–1.2)
BUN: 97 mg/dL — ABNORMAL HIGH (ref 6–20)
CHLORIDE: 111 mmol/L (ref 101–111)
CO2: 18 mmol/L — AB (ref 22–32)
Calcium: 8.5 mg/dL — ABNORMAL LOW (ref 8.9–10.3)
Creatinine, Ser: 17.36 mg/dL — ABNORMAL HIGH (ref 0.61–1.24)
GFR calc Af Amer: 3 mL/min — ABNORMAL LOW (ref >60)
GFR calc non Af Amer: 3 mL/min — ABNORMAL LOW (ref >60)
GLUCOSE: 97 mg/dL (ref 65–99)
POTASSIUM: 3.5 mmol/L (ref 3.5–5.1)
SODIUM: 141 mmol/L (ref 135–145)
TOTAL PROTEIN: 5.6 g/dL — AB (ref 6.5–8.1)

## 2017-10-14 LAB — PHOSPHORUS: Phosphorus: 7.2 mg/dL — ABNORMAL HIGH (ref 2.5–4.6)

## 2017-10-14 LAB — ABO/RH: ABO/RH(D): B POS

## 2017-10-14 MED ORDER — ACETAMINOPHEN 325 MG PO TABS
650.0000 mg | ORAL_TABLET | Freq: Four times a day (QID) | ORAL | Status: DC | PRN
Start: 2017-10-14 — End: 2017-10-20

## 2017-10-14 MED ORDER — ACETAMINOPHEN 650 MG RE SUPP: 650.0000 mg | Freq: Four times a day (QID) | RECTAL | Status: DC | PRN

## 2017-10-14 MED ORDER — SODIUM CHLORIDE 0.9 % IV SOLN
INTRAVENOUS | Status: DC
Start: ? — End: 2017-10-14

## 2017-10-14 MED ORDER — ENOXAPARIN SODIUM 30 MG/0.3ML ~~LOC~~ SOLN
30.0000 mg | SUBCUTANEOUS | Status: DC
  Administered 2017-10-14 – 2017-10-19 (×6): 30 mg via SUBCUTANEOUS
  Filled 2017-10-14 (×6): qty 0.3

## 2017-10-14 MED ORDER — AMLODIPINE BESYLATE 10 MG PO TABS
10.0000 mg | ORAL_TABLET | Freq: Every day | ORAL | Status: DC
  Administered 2017-10-15 – 2017-10-20 (×5): 10 mg via ORAL
  Filled 2017-10-14 (×5): qty 1

## 2017-10-14 MED ORDER — POLYETHYLENE GLYCOL 3350 17 G PO PACK: 17.0000 g | PACK | Freq: Every day | ORAL | Status: DC | PRN

## 2017-10-14 MED ORDER — SODIUM CHLORIDE 0.9 % IV SOLN: 250.0000 mL | INTRAVENOUS | Status: DC | PRN

## 2017-10-14 MED ORDER — INFLUENZA VAC SPLIT QUAD 0.5 ML IM SUSY
0.5000 mL | PREFILLED_SYRINGE | INTRAMUSCULAR | Status: AC
  Administered 2017-10-15: 0.5 mL via INTRAMUSCULAR

## 2017-10-14 MED ORDER — SODIUM CHLORIDE 0.9% FLUSH
3.0000 mL | Freq: Two times a day (BID) | INTRAVENOUS | Status: DC
Start: 2017-10-14 — End: 2017-10-20
  Administered 2017-10-14 – 2017-10-20 (×10): 3 mL via INTRAVENOUS

## 2017-10-14 MED ORDER — SODIUM CHLORIDE 0.9% FLUSH: 3.0000 mL | INTRAVENOUS | Status: DC | PRN

## 2017-10-14 MED ORDER — ONDANSETRON HCL 4 MG/2ML IJ SOLN: 4.0000 mg | Freq: Four times a day (QID) | INTRAMUSCULAR | Status: DC | PRN

## 2017-10-14 MED ORDER — HYDRALAZINE HCL 50 MG PO TABS
100.0000 mg | ORAL_TABLET | Freq: Three times a day (TID) | ORAL | Status: DC
  Administered 2017-10-14 – 2017-10-20 (×14): 100 mg via ORAL
  Filled 2017-10-14 (×15): qty 2

## 2017-10-14 MED ORDER — ASPIRIN 81 MG PO CHEW
81.0000 mg | CHEWABLE_TABLET | Freq: Every day | ORAL | Status: DC
  Administered 2017-10-15 – 2017-10-20 (×5): 81 mg via ORAL
  Filled 2017-10-14 (×5): qty 1

## 2017-10-14 MED ORDER — ONDANSETRON HCL 4 MG PO TABS: 4.0000 mg | ORAL_TABLET | Freq: Four times a day (QID) | ORAL | Status: DC | PRN

## 2017-10-14 NOTE — H&P (Addendum)
[facility] History and Physical  Devon [name] ZOX:[id number] DOB: Apr 17, 1966 DOA: 10/14/2017  Referring physician: [facility]  PCP: Corwin LevinsJohn, [name], MD   Chief Complaint: Low blood count, nausea  HPI: Devon [name] is a 51 y.o. male with hx of HTN and advanced CKD, has been getting ESA shots weekly and went for labs today which showed creat of 16.  His kidney doctor was called (Dr Hyman HopesWebb) was called with the labs and he recommended pt go to hospital and be evaluated for initiation of dialysis.   [facility] and was transferred here for admission.  Pt is pleasant, has been having CKD for 1-2 yrs, baseline recent creat around 9- 10 per his wife who is at bedside.  Having lots of problems with poor appetite, some wt loss (10 lbs) and nausea /vomiting which has been intermittent.  Has had some DOE lately.  His main c/o is dry mouth / thirst for last 1-2 weeks.  Labs at [facility] today showed creat 16 and Hb 7.6.    Pt has AVF in L arm placed > 1 yr ago.  He sees Dr Hyman HopesWebb every 6 wks.     ROS  denies CP  no joint pain   no HA  no blurry vision  no rash  no diarrhea  no nausea/ vomiting  no dysuria  no difficulty voiding  no change in urine color    Past Medical History  Past Medical History:  Diagnosis Date  . 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 Allergy   . Chronic kidney disease   . Hyperlipidemia   . Hypersomnolence 04/29/2012  . Hypertension   . OSA (obstructive sleep apnea) 04/29/2012   no cpap  . Pneumonia   . Sarcoidosis (HCC)   . 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 BASCILIC VEIN TRANSPOSITION Left 09/03/2016   Procedure: FIRST STAGE LEFT BRACHIOCEPHALIC  VEIN TRANSPOSITION;  Surgeon: Fransisco HertzBrian L Chen, MD;  [facility];  Service: Vascular;  Laterality: Left;  . corrective left eye surgery    . HERNIA REPAIR Right    inguinal  .  LIVER BIOPSY    . LUNG BIOPSY    . RENAL BIOPSY     Family History  Family History  Problem Relation Age of Onset  . Hypothyroidism Mother   . Lung cancer Father    Social History  reports that he quit smoking about 14 months ago. His smoking use included cigarettes. He has a 0.25 pack-year smoking history. he has never used smokeless tobacco. He reports that he drinks alcohol. He reports that he does not use drugs. Allergies No Known Allergies Home medications Prior to Admission medications   Medication Sig Start Date End Date Taking? Authorizing Provider  amLODipine (NORVASC) 10 MG tablet Take 1 tablet (10 mg total) by mouth daily. 04/29/16  Yes Corwin LevinsJohn, [name], MD  aspirin 81 MG EC tablet TAKE 1 TABLET BY MOUTH EVERY DAY Patient taking differently: Take 81 mg by mouth every day 05/27/13  Yes Corwin LevinsJohn, [name], MD  fluticasone [facility](FLONASE) 50 MCG/ACT nasal spray Place 2 sprays into both nostrils daily.   Yes [provider]  furosemide (LASIX) 40 MG tablet Take 40 mg by mouth daily.   Yes [provider]  hydrALAZINE (APRESOLINE) 50 MG tablet Take 2 tablets (100 mg total) by mouth  3 (three) times daily. 09/03/16  Yes Rhyne, Samantha J, PA-C  sodium bicarbonate 650 MG tablet Take 1 tablet by mouth 3 (three) times daily. 10/12/17  Yes [provider]  hydrochlorothiazide (HYDRODIURIL) 25 MG tablet Take 1 tablet (25 mg total) by mouth daily. Yearly physical is due w/labs must see md for refills Patient not taking: Reported on 10/14/2017 04/29/16   Corwin LevinsJohn, [name], MD   Liver Function Tests No results for input(s): AST, ALT, ALKPHOS, BILITOT, PROT, ALBUMIN in the last 168 hours. No results for input(s): LIPASE, AMYLASE in the last 168 hours. CBC No results for input(s): WBC, NEUTROABS, HGB, HCT, MCV, PLT in the last 168 hours. Basic Metabolic Panel No results for input(s): NA, K, CL, CO2, GLUCOSE, BUN, CREATININE, CALCIUM, PHOS in the last 168 hours.  Invalid input(s):  ALB   Vitals:   10/14/17 1749  BP: (!) 180/92  Pulse: 96  Resp: 20  Temp: 98.4 F (36.9 C)  TempSrc: Oral  SpO2: 98%  Weight: 123.8 kg (272 lb 14.9 oz)  Height: 6\' 5"  (1.956 m)   Exam: Gen alert, no distress, calm No rash, cyanosis or gangrene Sclera anicteric, throat clear and dry  No jvd or bruits Chest clear bilat RRR no MRG Abd soft ntnd no mass or ascites +bs GU normal male MS no joint effusions or deformity Ext no LE or UE edema / no wounds or ulcers Neuro is alert, Ox 3 , nf LUA AVF +bruit   Assessment: 1. CKD stage V - creat 16 today , usual 10, sent to hospital for transfusion and to start on HD. He is stable. Consult renal in am.   2. Anemia of CKD - Hb 7.6, get T&S,  defer transfusion to renal, no active bleeding 3. HTN - long standing, on norvasc/ hydralazine at home and HCTZ/ lasix 4. Volume - no vol excess on exam, may be a bit dry. Hold diuretics for now.  5. DOE - anemia prob, get CXR baseline     Plan - as above       Kimberli Winne D [facility] Pager [phone]   If 7PM-7AM, please contact night-coverage [email or web address] Password [email or web address] 10/14/2017, 6:14 PM

## 2017-10-14 NOTE — Progress Notes (Signed)
Admission note:  Arrival Method: Patient arrived on stretcher from [facility]. Mental Orientation:  Alert and oriented x 4. Telemetry: no orders yet. Assessment: See doc flow sheets. Skin: warm, dry and intact. IV: Right AC SL. Pain: Denies any pain currently. Tubes: N/A Safety Measures: Bed in low position, call bell and phone within reach. Fall Prevention Safety Plan: Reviewed the plan, understood and acknowledged. Admission Screening: In progress. 6700 Orientation: Patient has been oriented to the unit, staff and to the room.

## 2017-10-15 LAB — RENAL FUNCTION PANEL
Albumin: 2.4 g/dL — ABNORMAL LOW (ref 3.5–5.0)
Anion gap: 11 (ref 5–15)
BUN: 100 mg/dL — AB (ref 6–20)
CHLORIDE: 110 mmol/L (ref 101–111)
CO2: 18 mmol/L — ABNORMAL LOW (ref 22–32)
CREATININE: 17.22 mg/dL — AB (ref 0.61–1.24)
Calcium: 8.2 mg/dL — ABNORMAL LOW (ref 8.9–10.3)
GFR calc Af Amer: 3 mL/min — ABNORMAL LOW (ref >60)
GFR, EST NON AFRICAN AMERICAN: 3 mL/min — AB (ref >60)
GLUCOSE: 115 mg/dL — AB (ref 65–99)
POTASSIUM: 3.7 mmol/L (ref 3.5–5.1)
Phosphorus: 7.5 mg/dL — ABNORMAL HIGH (ref 2.5–4.6)
Sodium: 139 mmol/L (ref 135–145)

## 2017-10-15 LAB — CBC
HEMATOCRIT: 22.6 % — AB (ref 39.0–52.0)
Hemoglobin: 7.3 g/dL — ABNORMAL LOW (ref 13.0–17.0)
MCH: 27 pg (ref 26.0–34.0)
MCHC: 32.3 g/dL (ref 30.0–36.0)
MCV: 83.7 fL (ref 78.0–100.0)
PLATELETS: 275 10*3/uL (ref 150–400)
RBC: 2.7 MIL/uL — ABNORMAL LOW (ref 4.22–5.81)
RDW: 14.6 % (ref 11.5–15.5)
WBC: 6.4 10*3/uL (ref 4.0–10.5)

## 2017-10-15 LAB — IRON AND TIBC
IRON: 28 ug/dL — AB (ref 45–182)
Saturation Ratios: 17 % — ABNORMAL LOW (ref 17.9–39.5)
TIBC: 168 ug/dL — AB (ref 250–450)
UIBC: 140 ug/dL

## 2017-10-15 LAB — HIV ANTIBODY (ROUTINE TESTING W REFLEX): HIV Screen 4th Generation wRfx: NONREACTIVE

## 2017-10-15 LAB — FERRITIN: FERRITIN: 288 ng/mL (ref 24–336)

## 2017-10-15 LAB — ALT: ALT: 13 U/L — ABNORMAL LOW (ref 17–63)

## 2017-10-15 LAB — HEPATITIS B SURFACE ANTIGEN: Hepatitis B Surface Ag: NEGATIVE

## 2017-10-15 MED ORDER — HEPARIN SODIUM (PORCINE) 1000 UNIT/ML DIALYSIS: 20.0000 [IU]/kg | INTRAMUSCULAR | Status: DC | PRN

## 2017-10-15 NOTE — Consult Note (Signed)
Reason for Consult: progressive CKD stage 5 Referring Physician:  Catha GosselinMikhail, MD  Devon [name] is an 51 y.o. male.  HPI: Mr. 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 Hyman HopesWebb was notified that his Scr was up to 16.  [facility] was [facility].  His Hgb had also dropped to 7.6 and he admits to having intermittent N/V/anorexia, and 10lb weight loss over the last several weeks.  He was last seen in our office on 09/07/17 and his Scr was 14.88 at that time but without uremic symptoms.  We were consulted to evaluate the need to initiate HD.  He had a left BC AVF placed on 09/03/16 by Dr. Imogene Burnhen and has been evaluated for a kidney transplant, however is not currently active on the list.  He had been seeing Dr. Hyman HopesWebb every 6 weeks in preparation for initiating HD.     The trend in Scr from our office is seen below.   Trend in Creatinine: Creatinine, Ser  Date/Time Value Ref Range Status  10/14/2017 08:49 PM 17.36 (H) 0.61 - 1.24 mg/dL Final  98/11/914710/22/2018 82.9514.88    07/15/2017 10.91    06/03/2017 9.56    04/22/2017 8.94    03/10/2017 7.07    01/21/2017 7.09    12/09/2016 6.49    10/29/2016 5.91    04/29/2016 04:51 PM 4.30 (H) 0.40 - 1.50 mg/dL Final  62/13/086506/13/2013 78:[id number] AM 1.7 (H) 0.4 - 1.5 mg/dL Final    PMH:   Past Medical History:  Diagnosis Date  . 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 CKD (chronic kidney disease), stage V (HCC)    "to start dialysis 10/14/2017"  . Heart murmur   . Hyperlipidemia   . Hypersomnolence 04/29/2012  . Hypertension   . OSA (obstructive sleep apnea) 04/29/2012   "have CPAP; don't use it" (10/14/2017)  . Pneumonia X 1  . Sarcoidosis    "dormant right now" (10/14/2017)  . Seasonal allergies   . 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 BASCILIC VEIN TRANSPOSITION Left 09/03/2016   Procedure: FIRST STAGE LEFT BRACHIOCEPHALIC  VEIN TRANSPOSITION;  Surgeon: Fransisco HertzBrian L Chen, MD;  [facility];  Service: Vascular;  Laterality: Left;  . BIOPSY THYROID  2017   "benign"  . EYE MUSCLE SURGERY Left    "related to sarcoidosis"  . INGUINAL HERNIA REPAIR Right   . KNEE ARTHROSCOPY Right   . LIVER BIOPSY    . LUNG BIOPSY  07/2015  . RENAL BIOPSY  07/2015    Allergies: No Known Allergies  Medications:   Prior to Admission medications   Medication Sig Start Date End Date Taking? Authorizing Provider  amLODipine (NORVASC) 10 MG tablet Take 1 tablet (10 mg total) by mouth daily. 04/29/16  Yes Corwin LevinsJohn, [name], MD  aspirin 81 MG EC tablet TAKE 1 TABLET BY MOUTH EVERY DAY Patient taking differently: Take 81 mg by mouth every day 05/27/13  Yes Corwin LevinsJohn, [name], MD  fluticasone [facility](FLONASE) 50 MCG/ACT nasal spray Place 2 sprays into both nostrils daily.   Yes [provider]  hydrALAZINE (APRESOLINE) 50 MG tablet Take 2 tablets (100 mg total)  by mouth 3 (three) times daily. 09/03/16  Yes Rhyne, Samantha J, PA-C  sodium bicarbonate 650 MG tablet Take 1 tablet by mouth 3 (three) times daily. 10/12/17  Yes [provider]    Inpatient medications: . amLODipine  10 mg Oral Daily  . aspirin  81 mg Oral Daily  . enoxaparin (LOVENOX) injection  30 mg Subcutaneous Q24H  . hydrALAZINE  100 mg Oral TID  . sodium chloride flush  3 mL Intravenous Q12H    Discontinued Meds:   Medications Discontinued During This Encounter  Medication Reason  . hydrochlorothiazide (HYDRODIURIL) 25 MG tablet   . furosemide (LASIX) 40 MG tablet     Social History:  reports that he quit smoking about 14 months ago. His smoking use included cigarettes. He has a 0.25 pack-year smoking history. he has never used smokeless tobacco. He reports that he drinks alcohol. He reports  that he does not use drugs.  Family History:   Family History  Problem Relation Age of Onset  . Hypothyroidism Mother   . Lung cancer Father     Pertinent items are noted in HPI. Weight change:   Intake/Output Summary (Last 24 hours) at 10/15/2017 1533 Last data filed at 10/15/2017 1421 Gross per 24 hour  Intake 843 ml  Output 1100 ml  Net -257 ml   BP (!) 152/75 (BP Location: Right Arm)   Pulse 81   Temp 99 F (37.2 C) (Oral)   Resp 20   Ht 6\' 5"  (1.956 m)   Wt 123.8 kg (272 lb 14.9 oz)   SpO2 98%   BMI 32.36 kg/m  Vitals:   10/14/17 1749 10/14/17 2120 10/15/17 0511 10/15/17 1000  BP: (!) 180/92 (!) 143/69 (!) 154/83 (!) 152/75  Pulse: 96 88 84 81  Resp: 20 20 18 20   Temp: 98.4 F (36.9 C) 98.7 F (37.1 C) 99.2 F (37.3 C) 99 F (37.2 C)  TempSrc: Oral Oral Oral Oral  SpO2: 98% 95% 96% 98%  Weight: 123.8 kg (272 lb 14.9 oz)     Height: 6\' 5"  (1.956 m)        General appearance: alert, cooperative and no distress Head: Normocephalic, without obvious abnormality, atraumatic Resp: clear to auscultation bilaterally Cardio: regular rate and rhythm, S1, S2 normal, no murmur, click, rub or gallop GI: soft, non-tender; bowel sounds normal; no masses,  no organomegaly Extremities: extremities normal, atraumatic, no cyanosis or edema and LUE AVF +T/B and mature for use Neuro: no asterixis  Labs: Basic Metabolic Panel: Recent Labs  Lab 10/14/17 2049  NA 141  K 3.5  CL 111  CO2 18*  GLUCOSE 97  BUN 97*  CREATININE 17.36*  ALBUMIN 2.6*  CALCIUM 8.5*  PHOS 7.2*   Liver Function Tests: Recent Labs  Lab 10/14/17 2049  AST 17  ALT 16*  ALKPHOS 63  BILITOT 0.4  PROT 5.6*  ALBUMIN 2.6*   No results for input(s): LIPASE, AMYLASE in the last 168 hours. No results for input(s): AMMONIA in the last 168 hours. CBC: Recent Labs  Lab 10/14/17 2049  WBC 7.3  HGB 7.7*  HCT 23.8*  MCV 83.5  PLT 304   PT/INR: @LABRCNTIP (inr:5) Cardiac Enzymes: )No  results for input(s): CKTOTAL, CKMB, CKMBINDEX, TROPONINI in the last 168 hours. CBG: No results for input(s): GLUCAP in the last 168 hours.  Iron Studies: No results for input(s): IRON, TIBC, TRANSFERRIN, FERRITIN in the last 168 hours.  Xrays/Other Studies: Dg Chest 2 View  Result Date:  10/14/2017 CLINICAL DATA:  Shortness of breath for 2 days, progressive. Uremia. EXAM: CHEST  2 VIEW COMPARISON:  Radiograph 01/09/2017 FINDINGS: Mild cardiomegaly. Vascular congestion with mild peribronchial cuffing. No confluent consolidation. No pleural fluid or pneumothorax. No acute osseous abnormalities. IMPRESSION: Mild cardiomegaly and vascular congestion. Mild peribronchial cuffing, possible mild pulmonary edema. Electronically Signed   By: Rubye OaksMelanie  Ehinger M.D.   On: 10/14/2017 21:38     Assessment/Plan: 1.  CKD stage 5 now with uremic symptoms- will plan to initiate HD and start CLIP process although he reports that he is interested in home HD. 2. Anemia of CKD stage 5- s/p Procrit 10,000 units SQ every week.  Will check iron stores and continue with ESA while on HD +/- IV Iron 3. HTN- continue with home meds, however hold prior to HD 4. Volume- appears euvolemic 5. DOE- related to worsening anemia 6. Vascular access- LUE AVF mature and ready for use 7. Disposition- started the CLIP process ( I contacted hometraining to see if he can be started there instead of in-center.  They will check their schedule and be in contact tomorrow).    Julien NordmannJoseph A Courney Garrod 10/15/2017, 3:33 PM

## 2017-10-15 NOTE — Procedures (Signed)
I was present at this dialysis session. I have reviewed the session itself and made appropriate changes.   Filed Weights   10/14/17 1749  Weight: 123.8 kg (272 lb 14.9 oz)    Recent Labs  Lab 10/14/17 2049  NA 141  K 3.5  CL 111  CO2 18*  GLUCOSE 97  BUN 97*  CREATININE 17.36*  CALCIUM 8.5*  PHOS 7.2*    Recent Labs  Lab 10/14/17 2049  WBC 7.3  HGB 7.7*  HCT 23.8*  MCV 83.5  PLT 304    Scheduled Meds: . amLODipine  10 mg Oral Daily  . aspirin  81 mg Oral Daily  . enoxaparin (LOVENOX) injection  30 mg Subcutaneous Q24H  . hydrALAZINE  100 mg Oral TID  . sodium chloride flush  3 mL Intravenous Q12H   Continuous Infusions: . sodium chloride     PRN Meds:.sodium chloride, acetaminophen **OR** acetaminophen, ondansetron **OR** ondansetron (ZOFRAN) IV, polyethylene glycol, sodium chloride flush   Irena CordsJoseph A Aireonna Bauer,  MD 10/15/2017, 5:27 PM

## 2017-10-15 NOTE — Progress Notes (Signed)
PROGRESS NOTE    Devon SiaRedondo Ostlund  WUJ:[id number] DOB: 06-Feb-1966 DOA: 10/14/2017 PCP: Cheral BayHawks, Aldene N, MD   No chief complaint on file.   Brief Narrative:  HPI On 10/14/2017 by Dr. Delano Metzobert Schertz Devon [name] is a 51 y.o. male with hx of HTN and advanced CKD, has been getting ESA shots weekly and went for labs today which showed creat of 16.  His kidney doctor was called (Dr Hyman HopesWebb) was called with the labs and he recommended pt go to hospital and be evaluated for initiation of dialysis.   [facility] and was transferred here for admission. Pt is pleasant, has been having CKD for 1-2 yrs, baseline recent creat around 9- 10 per his wife who is at bedside.  Having lots of problems with poor appetite, some wt loss (10 lbs) and nausea /vomiting which has been intermittent.  Has had some DOE lately.  His main c/o is dry mouth / thirst for last 1-2 weeks.  [facility] today showed creat 16 and Hb 7.6.   Pt has AVF in L arm placed > 1 yr ago.  He sees Dr Hyman HopesWebb every 6 wks.    Assessment & Plan   Progressing Chronic kidney disease, stage V -Baseline creatinine of approximately 10, currently 17.36 -Nephrology consulted and appreciated, pending recommendations -diuretics held  Anemia of chronic kidney disease -Hemoglobin 7.7 -Baseline hemoglobin in 2017 was 11 -does not appear to be actively bleeding -will continue to monitor CBC  Essential Hypertension -Continue amlodipine, hydralazine -lasix/HCTZ held  Dyspnea upon exertion -Possibly secondary to anemia -CXR showed mild cardiomegaly and vascular congestion  DVT Prophylaxis  lovenox  Code Status: Full  Family Communication: None at bedside  Disposition Plan: Admitted, pending nephrology consult  Consultants Nephrology  Procedures  None  Antibiotics   Anti-infectives (From admission, onward)   None      Subjective:   Devon [name] seen and examined today.  Feels some shortness of breath.  Denies chest pain, abdominal pain, nausea, vomiting, diarrhea, constipation, headache, dizziness.   Objective:   Vitals:   10/14/17 1749 10/14/17 2120 10/15/17 0511 10/15/17 1000  BP: (!) 180/92 (!) 143/69 (!) 154/83 (!) 152/75  Pulse: 96 88 84 81  Resp: 20 20 18 20   Temp: 98.4 F (36.9 C) 98.7 F (37.1 C) 99.2 F (37.3 C) 99 F (37.2 C)  TempSrc: Oral Oral Oral Oral  SpO2: 98% 95% 96% 98%  Weight: 123.8 kg (272 lb 14.9 oz)     Height: 6\' 5"  (1.956 m)       Intake/Output Summary (Last 24 hours) at 10/15/2017 1403 Last data filed at 10/15/2017 1050 Gross per 24 hour  Intake 603 ml  Output 1100 ml  Net -497 ml   Filed Weights   10/14/17 1749  Weight: 123.8 kg (272 lb 14.9 oz)    Exam  General: Well developed, well nourished, NAD, appears stated age  HEENT: NCAT, mucous membranes moist.   Cardiovascular: S1 S2 auscultated, RRR, no murmurs  Respiratory: Clear to auscultation bilaterally with equal chest rise  Abdomen: Soft, nontender, nondistended, + bowel sounds  Extremities: warm dry without cyanosis clubbing or edema. LUE AVF +B  Neuro: AAOx3, nonfocal  Psych: Normal affect and demeanor   Data Reviewed: I have personally reviewed following labs and imaging studies  CBC: Recent Labs  Lab 10/14/17 2049  WBC 7.3  HGB 7.7*  HCT 23.8*  MCV 83.5  PLT 304   Basic Metabolic Panel: Recent  Labs  Lab 10/14/17 2049  NA 141  K 3.5  CL 111  CO2 18*  GLUCOSE 97  BUN 97*  CREATININE 17.36*  CALCIUM 8.5*  PHOS 7.2*   GFR: Estimated Creatinine Clearance: 7.4 mL/min (A) (by C-G formula based on SCr of 17.36 mg/dL (H)). Liver Function Tests: Recent Labs  Lab 10/14/17 2049  AST 17  ALT 16*  ALKPHOS 63  BILITOT 0.4  PROT 5.6*  ALBUMIN 2.6*   No results for input(s): LIPASE, AMYLASE in the last 168 hours. No results for input(s): AMMONIA in the last 168 hours. Coagulation Profile: No results for input(s): INR, PROTIME in the last 168  hours. Cardiac Enzymes: No results for input(s): CKTOTAL, CKMB, CKMBINDEX, TROPONINI in the last 168 hours. BNP (last 3 results) No results for input(s): PROBNP in the last 8760 hours. HbA1C: No results for input(s): HGBA1C in the last 72 hours. CBG: No results for input(s): GLUCAP in the last 168 hours. Lipid Profile: No results for input(s): CHOL, HDL, LDLCALC, TRIG, CHOLHDL, LDLDIRECT in the last 72 hours. Thyroid Function Tests: No results for input(s): TSH, T4TOTAL, FREET4, T3FREE, THYROIDAB in the last 72 hours. Anemia Panel: No results for input(s): VITAMINB12, FOLATE, FERRITIN, TIBC, IRON, RETICCTPCT in the last 72 hours. Urine analysis:    Component Value Date/Time   COLORURINE YELLOW 04/29/2016 1651   APPEARANCEUR Cloudy (A) 04/29/2016 1651   LABSPEC 1.020 04/29/2016 1651   PHURINE 6.0 04/29/2016 1651   GLUCOSEU NEGATIVE 04/29/2016 1651   HGBUR TRACE-INTACT (A) 04/29/2016 1651   BILIRUBINUR NEGATIVE 04/29/2016 1651   KETONESUR NEGATIVE 04/29/2016 1651   UROBILINOGEN 0.2 04/29/2016 1651   NITRITE NEGATIVE 04/29/2016 1651   LEUKOCYTESUR NEGATIVE 04/29/2016 1651   Sepsis Labs: @LABRCNTIP (procalcitonin:4,lacticidven:4)  )No results found for this or any previous visit (from the past 240 hour(s)).    Radiology Studies: Dg Chest 2 View  Result Date: 10/14/2017 CLINICAL DATA:  Shortness of breath for 2 days, progressive. Uremia. EXAM: CHEST  2 VIEW COMPARISON:  Radiograph 01/09/2017 FINDINGS: Mild cardiomegaly. Vascular congestion with mild peribronchial cuffing. No confluent consolidation. No pleural fluid or pneumothorax. No acute osseous abnormalities. IMPRESSION: Mild cardiomegaly and vascular congestion. Mild peribronchial cuffing, possible mild pulmonary edema. Electronically Signed   By: Rubye OaksMelanie  Ehinger M.D.   On: 10/14/2017 21:38     Scheduled Meds: . amLODipine  10 mg Oral Daily  . aspirin  81 mg Oral Daily  . enoxaparin (LOVENOX) injection  30 mg  Subcutaneous Q24H  . hydrALAZINE  100 mg Oral TID  . sodium chloride flush  3 mL Intravenous Q12H   Continuous Infusions: . sodium chloride       LOS: 1 day   Time Spent in minutes   30 minutes  Tamula Morrical D.O. on 10/15/2017 at 2:03 PM  Between 7am to 7pm - Pager - [phone]  After 7pm go to [email or web address] - password [email or web address]  And look for the night coverage person covering for me after hours  [facility] Office  [phone]

## 2017-10-16 LAB — CBC
HCT: 22.3 % — ABNORMAL LOW (ref 39.0–52.0)
Hemoglobin: 7.2 g/dL — ABNORMAL LOW (ref 13.0–17.0)
MCH: 27 pg (ref 26.0–34.0)
MCHC: 32.3 g/dL (ref 30.0–36.0)
MCV: 83.5 fL (ref 78.0–100.0)
PLATELETS: 254 10*3/uL (ref 150–400)
RBC: 2.67 MIL/uL — ABNORMAL LOW (ref 4.22–5.81)
RDW: 14.4 % (ref 11.5–15.5)
WBC: 6 10*3/uL (ref 4.0–10.5)

## 2017-10-16 LAB — BASIC METABOLIC PANEL
ANION GAP: 9 (ref 5–15)
BUN: 69 mg/dL — ABNORMAL HIGH (ref 6–20)
CALCIUM: 8 mg/dL — AB (ref 8.9–10.3)
CO2: 23 mmol/L (ref 22–32)
Chloride: 107 mmol/L (ref 101–111)
Creatinine, Ser: 13.57 mg/dL — ABNORMAL HIGH (ref 0.61–1.24)
GFR, EST AFRICAN AMERICAN: 4 mL/min — AB (ref >60)
GFR, EST NON AFRICAN AMERICAN: 4 mL/min — AB (ref >60)
GLUCOSE: 85 mg/dL (ref 65–99)
Potassium: 3.5 mmol/L (ref 3.5–5.1)
Sodium: 139 mmol/L (ref 135–145)

## 2017-10-16 LAB — HEPATITIS B SURFACE ANTIBODY,QUALITATIVE: Hep B S Ab: REACTIVE

## 2017-10-16 LAB — HEPATITIS B CORE ANTIBODY, IGM: HEP B C IGM: NEGATIVE

## 2017-10-16 MED ORDER — SODIUM CHLORIDE 0.9 % IV SOLN
125.0000 mg | Freq: Every day | INTRAVENOUS | Status: AC
  Administered 2017-10-16 – 2017-10-19 (×4): 125 mg via INTRAVENOUS
  Filled 2017-10-16 (×6): qty 10

## 2017-10-16 NOTE — Procedures (Signed)
Tolerating hemodialysis.  No instability. Hgb 7.2 iron sat low, will supplement with IV iron. Devon PoagAlvin C Shadavia Dampier

## 2017-10-16 NOTE — Progress Notes (Signed)
PROGRESS NOTE    Devon SiaRedondo Rabideau  ZOX:[id number] DOB: January 31, 1966 DOA: 10/14/2017 PCP: Cheral BayHawks, Aldene N, MD   No chief complaint on file.   Brief Narrative:  HPI On 10/14/2017 by Dr. Delano Metzobert Schertz Devon [name] is a 51 y.o. male with hx of HTN and advanced CKD, has been getting ESA shots weekly and went for labs today which showed creat of 16.  His kidney doctor was called (Dr Hyman HopesWebb) was called with the labs and he recommended pt go to hospital and be evaluated for initiation of dialysis.   [facility] and was transferred here for admission. Pt is pleasant, has been having CKD for 1-2 yrs, baseline recent creat around 9- 10 per his wife who is at bedside.  Having lots of problems with poor appetite, some wt loss (10 lbs) and nausea /vomiting which has been intermittent.  Has had some DOE lately.  His main c/o is dry mouth / thirst for last 1-2 weeks.  [facility] showed creat 16 and Hb 7.6.   Pt has AVF in L arm placed > 1 yr ago.  He sees Dr Hyman HopesWebb every 6 wks.    Assessment & Plan   Progressing Chronic kidney disease, stage V with uremia -Baseline creatinine of approximately 10, upto 17.36 on admission -Nephrology consulted and appreciated, started HD on 11/29. Will dialyze today.  -diuretics held -CLIP process started  Anemia of chronic kidney disease -Hemoglobin 7.2 -Baseline hemoglobin in 2017 was 11 -does not appear to be actively bleeding -Anemia panel: Iron 28, Ferritin 288 -discussed with nephrology, avoid blood transfusions if possible -will continue to monitor CBC  Essential Hypertension -Continue amlodipine, hydralazine -lasix/HCTZ held  Dyspnea upon exertion -Possibly secondary to anemia -CXR showed mild cardiomegaly and vascular congestion  DVT Prophylaxis  lovenox  Code Status: Full  Family Communication: None at bedside  Disposition Plan: Admitted, pending CLIP  Consultants Nephrology  Procedures  None  Antibiotics     Anti-infectives (From admission, onward)   None      Subjective:   Devon Siaedondo Yanes seen and examined today.  Feels breathing has improved. Denies chest pain, abdominal pain, N/V/D/C, headaches. Patient states he is looking forward to dialysis today.  Objective:   Vitals:   10/16/17 0530 10/16/17 0853 10/16/17 0900 10/16/17 0955  BP: (!) 178/77  (!) 166/76 (!) 166/76  Pulse: 80 88 85   Resp: 18 18 18    Temp: 98.7 F (37.1 C) 98.6 F (37 C)    TempSrc: Oral Oral    SpO2: 100%  99%   Weight:      Height:        Intake/Output Summary (Last 24 hours) at 10/16/2017 1346 Last data filed at 10/16/2017 1050 Gross per 24 hour  Intake 960 ml  Output 1050 ml  Net -90 ml   Filed Weights   10/15/17 1948 10/15/17 2127 10/16/17 0500  Weight: 122 kg (268 lb 15.4 oz) 122 kg (268 lb 15.4 oz) 122 kg (268 lb 15.4 oz)   Exam  General: Well developed, well nourished, NAD, appears stated age  HEENT: NCAT, mucous membranes moist.   Cardiovascular: S1 S2 auscultated, RRR, no murmurs  Respiratory: Clear to auscultation bilaterally with equal chest rise  Abdomen: Soft, nontender, nondistended, + bowel sounds  Extremities: warm dry without cyanosis clubbing or edema. LUE AVF +B  Neuro: AAOx3, nonfocal  Psych: Normal affect and demeanor, pleasant  Data Reviewed: I have personally reviewed following labs and imaging  studies  CBC: Recent Labs  Lab 10/14/17 2049 10/15/17 1723 10/16/17 0514  WBC 7.3 6.4 6.0  HGB 7.7* 7.3* 7.2*  HCT 23.8* 22.6* 22.3*  MCV 83.5 83.7 83.5  PLT 304 275 254   Basic Metabolic Panel: Recent Labs  Lab 10/14/17 2049 10/15/17 1723 10/16/17 0514  NA 141 139 139  K 3.5 3.7 3.5  CL 111 110 107  CO2 18* 18* 23  GLUCOSE 97 115* 85  BUN 97* 100* 69*  CREATININE 17.36* 17.22* 13.57*  CALCIUM 8.5* 8.2* 8.0*  PHOS 7.2* 7.5*  --    GFR: Estimated Creatinine Clearance: 9.4 mL/min (A) (by C-G formula based on SCr of 13.57 mg/dL (H)). Liver Function  Tests: Recent Labs  Lab 10/14/17 2049 10/15/17 1723  AST 17  --   ALT 16* 13*  ALKPHOS 63  --   BILITOT 0.4  --   PROT 5.6*  --   ALBUMIN 2.6* 2.4*   No results for input(s): LIPASE, AMYLASE in the last 168 hours. No results for input(s): AMMONIA in the last 168 hours. Coagulation Profile: No results for input(s): INR, PROTIME in the last 168 hours. Cardiac Enzymes: No results for input(s): CKTOTAL, CKMB, CKMBINDEX, TROPONINI in the last 168 hours. BNP (last 3 results) No results for input(s): PROBNP in the last 8760 hours. HbA1C: No results for input(s): HGBA1C in the last 72 hours. CBG: No results for input(s): GLUCAP in the last 168 hours. Lipid Profile: No results for input(s): CHOL, HDL, LDLCALC, TRIG, CHOLHDL, LDLDIRECT in the last 72 hours. Thyroid Function Tests: No results for input(s): TSH, T4TOTAL, FREET4, T3FREE, THYROIDAB in the last 72 hours. Anemia Panel: Recent Labs    10/15/17 1440  FERRITIN 288  TIBC 168*  IRON 28*   Urine analysis:    Component Value Date/Time   COLORURINE YELLOW 04/29/2016 1651   APPEARANCEUR Cloudy (A) 04/29/2016 1651   LABSPEC 1.020 04/29/2016 1651   PHURINE 6.0 04/29/2016 1651   GLUCOSEU NEGATIVE 04/29/2016 1651   HGBUR TRACE-INTACT (A) 04/29/2016 1651   BILIRUBINUR NEGATIVE 04/29/2016 1651   KETONESUR NEGATIVE 04/29/2016 1651   UROBILINOGEN 0.2 04/29/2016 1651   NITRITE NEGATIVE 04/29/2016 1651   LEUKOCYTESUR NEGATIVE 04/29/2016 1651   Sepsis Labs: @LABRCNTIP (procalcitonin:4,lacticidven:4)  )No results found for this or any previous visit (from the past 240 hour(s)).    Radiology Studies: Dg Chest 2 View  Result Date: 10/14/2017 CLINICAL DATA:  Shortness of breath for 2 days, progressive. Uremia. EXAM: CHEST  2 VIEW COMPARISON:  Radiograph 01/09/2017 FINDINGS: Mild cardiomegaly. Vascular congestion with mild peribronchial cuffing. No confluent consolidation. No pleural fluid or pneumothorax. No acute osseous  abnormalities. IMPRESSION: Mild cardiomegaly and vascular congestion. Mild peribronchial cuffing, possible mild pulmonary edema. Electronically Signed   By: Rubye OaksMelanie  Ehinger M.D.   On: 10/14/2017 21:38     Scheduled Meds: . amLODipine  10 mg Oral Daily  . aspirin  81 mg Oral Daily  . enoxaparin (LOVENOX) injection  30 mg Subcutaneous Q24H  . hydrALAZINE  100 mg Oral TID  . sodium chloride flush  3 mL Intravenous Q12H   Continuous Infusions: . sodium chloride       LOS: 2 days   Time Spent in minutes   30 minutes  Ina Scrivens D.O. on 10/16/2017 at 1:46 PM  Between 7am to 7pm - Pager - [phone]  After 7pm go to [email or web address] - password [email or web address]  And look for the night coverage person covering for me after hours  [facility] Office  [phone]

## 2017-10-17 LAB — RENAL FUNCTION PANEL
ALBUMIN: 2.2 g/dL — AB (ref 3.5–5.0)
Anion gap: 10 (ref 5–15)
BUN: 47 mg/dL — AB (ref 6–20)
CALCIUM: 8.2 mg/dL — AB (ref 8.9–10.3)
CO2: 24 mmol/L (ref 22–32)
CREATININE: 10.94 mg/dL — AB (ref 0.61–1.24)
Chloride: 104 mmol/L (ref 101–111)
GFR calc Af Amer: 6 mL/min — ABNORMAL LOW (ref >60)
GFR, EST NON AFRICAN AMERICAN: 5 mL/min — AB (ref >60)
Glucose, Bld: 92 mg/dL (ref 65–99)
Phosphorus: 5.6 mg/dL — ABNORMAL HIGH (ref 2.5–4.6)
Potassium: 3.4 mmol/L — ABNORMAL LOW (ref 3.5–5.1)
SODIUM: 138 mmol/L (ref 135–145)

## 2017-10-17 LAB — CBC
HEMATOCRIT: 22.1 % — AB (ref 39.0–52.0)
Hemoglobin: 7.1 g/dL — ABNORMAL LOW (ref 13.0–17.0)
MCH: 27.1 pg (ref 26.0–34.0)
MCHC: 32.1 g/dL (ref 30.0–36.0)
MCV: 84.4 fL (ref 78.0–100.0)
PLATELETS: 209 10*3/uL (ref 150–400)
RBC: 2.62 MIL/uL — ABNORMAL LOW (ref 4.22–5.81)
RDW: 14.2 % (ref 11.5–15.5)
WBC: 5.8 10*3/uL (ref 4.0–10.5)

## 2017-10-17 NOTE — Progress Notes (Signed)
PROGRESS NOTE    Devon SiaRedondo Mohiuddin  ZHY:[id number] DOB: 1966/02/20 DOA: 10/14/2017 PCP: Cheral BayHawks, Aldene N, MD   No chief complaint on file.   Brief Narrative:  HPI On 10/14/2017 by Dr. Delano Metzobert Schertz Devon [name] is a 51 y.o. male with hx of HTN and advanced CKD, has been getting ESA shots weekly and went for labs today which showed creat of 16.  His kidney doctor was called (Dr Hyman HopesWebb) was called with the labs and he recommended pt go to hospital and be evaluated for initiation of dialysis.   [facility] went [facility] and was transferred here for admission. Pt is pleasant, has been having CKD for 1-2 yrs, baseline recent creat around 9- 10 per his wife who is at bedside.  Having lots of problems with poor appetite, some wt loss (10 lbs) and nausea /vomiting which has been intermittent.  Has had some DOE lately.  His main c/o is dry mouth / thirst for last 1-2 weeks.  L[facility] today showed creat 16 and Hb 7.6.   Pt has AVF in L arm placed > 1 yr ago.  He sees Dr Hyman HopesWebb every 6 wks.    Assessment & Plan   Progressing Chronic kidney disease, stage V with uremia -Baseline creatinine of approximately 10, upto 17.36 on admission -Nephrology consulted and appreciated, dialyzed on 11/29, 10/16/2017.  -diuretics held -CLIP process started  Anemia of chronic kidney disease -Hemoglobin 7.1 -Baseline hemoglobin in 2017 was 11 -does not appear to be actively bleeding -Anemia panel: Iron 28, Ferritin 288 -discussed with nephrology, avoid blood transfusions if possible -patient given IV iron (nuclecit) -will continue to monitor CBC  Essential Hypertension -Continue amlodipine, hydralazine -lasix/HCTZ held  Dyspnea upon exertion -Possibly secondary to anemia -CXR showed mild cardiomegaly and vascular congestion  DVT Prophylaxis  lovenox  Code Status: Full  Family Communication: None at bedside  Disposition Plan: Admitted, pending CLIP  Consultants Nephrology  Procedures    None  Antibiotics   Anti-infectives (From admission, onward)   None      Subjective:   Devon [name] seen and examined today.  Feels breathing has improved. Denies chest pain, abdominal pain, N/V/D/C, headaches. Patient states he is looking forward to dialysis today.  Objective:   Vitals:   10/16/17 2059 10/17/17 0437 10/17/17 0440 10/17/17 0918  BP: (!) 143/76  135/72 (!) 147/72  Pulse: 91  79 80  Resp: 19  20 18   Temp: 98.5 F (36.9 C)  98.4 F (36.9 C) 98.2 F (36.8 C)  TempSrc: Oral  Oral Oral  SpO2: 99%  97% 99%  Weight: 122.6 kg (270 lb 4.5 oz) 122.6 kg (270 lb 4.5 oz)    Height:        Intake/Output Summary (Last 24 hours) at 10/17/2017 1441 Last data filed at 10/17/2017 1435 Gross per 24 hour  Intake 830 ml  Output 1450 ml  Net -620 ml   Filed Weights   10/16/17 1737 10/16/17 2059 10/17/17 0437  Weight: 122.8 kg (270 lb 11.6 oz) 122.6 kg (270 lb 4.5 oz) 122.6 kg (270 lb 4.5 oz)   Exam  General: Well developed, well nourished, NAD, appears stated age  HEENT: NCAT, mucous membranes moist.   Cardiovascular: S1 S2 auscultated, RRR, no murmurs   Respiratory: Clear to auscultation bilaterally with equal chest rise  Abdomen: Soft, nontender, nondistended, + bowel sounds  Extremities: warm dry without cyanosis clubbing or edema. LUE AVF +B  Neuro: AAOx3, nonfocal  Psych: Appropriate  mood and affect, pleasant  Data Reviewed: I have personally reviewed following labs and imaging studies  CBC: Recent Labs  Lab 10/14/17 2049 10/15/17 1723 10/16/17 0514 10/17/17 0547  WBC 7.3 6.4 6.0 5.8  HGB 7.7* 7.3* 7.2* 7.1*  HCT 23.8* 22.6* 22.3* 22.1*  MCV 83.5 83.7 83.5 84.4  PLT 304 275 254 209   Basic Metabolic Panel: Recent Labs  Lab 10/14/17 2049 10/15/17 1723 10/16/17 0514 10/17/17 0547  NA 141 139 139 138  K 3.5 3.7 3.5 3.4*  CL 111 110 107 104  CO2 18* 18* 23 24  GLUCOSE 97 115* 85 92  BUN 97* 100* 69* 47*  CREATININE 17.36* 17.22*  13.57* 10.94*  CALCIUM 8.5* 8.2* 8.0* 8.2*  PHOS 7.2* 7.5*  --  5.6*   GFR: Estimated Creatinine Clearance: 11.7 mL/min (A) (by C-G formula based on SCr of 10.94 mg/dL (H)). Liver Function Tests: Recent Labs  Lab 10/14/17 2049 10/15/17 1723 10/17/17 0547  AST 17  --   --   ALT 16* 13*  --   ALKPHOS 63  --   --   BILITOT 0.4  --   --   PROT 5.6*  --   --   ALBUMIN 2.6* 2.4* 2.2*   No results for input(s): LIPASE, AMYLASE in the last 168 hours. No results for input(s): AMMONIA in the last 168 hours. Coagulation Profile: No results for input(s): INR, PROTIME in the last 168 hours. Cardiac Enzymes: No results for input(s): CKTOTAL, CKMB, CKMBINDEX, TROPONINI in the last 168 hours. BNP (last 3 results) No results for input(s): PROBNP in the last 8760 hours. HbA1C: No results for input(s): HGBA1C in the last 72 hours. CBG: No results for input(s): GLUCAP in the last 168 hours. Lipid Profile: No results for input(s): CHOL, HDL, LDLCALC, TRIG, CHOLHDL, LDLDIRECT in the last 72 hours. Thyroid Function Tests: No results for input(s): TSH, T4TOTAL, FREET4, T3FREE, THYROIDAB in the last 72 hours. Anemia Panel: Recent Labs    10/15/17 1440  FERRITIN 288  TIBC 168*  IRON 28*   Urine analysis:    Component Value Date/Time   COLORURINE YELLOW 04/29/2016 1651   APPEARANCEUR Cloudy (A) 04/29/2016 1651   LABSPEC 1.020 04/29/2016 1651   PHURINE 6.0 04/29/2016 1651   GLUCOSEU NEGATIVE 04/29/2016 1651   HGBUR TRACE-INTACT (A) 04/29/2016 1651   BILIRUBINUR NEGATIVE 04/29/2016 1651   KETONESUR NEGATIVE 04/29/2016 1651   UROBILINOGEN 0.2 04/29/2016 1651   NITRITE NEGATIVE 04/29/2016 1651   LEUKOCYTESUR NEGATIVE 04/29/2016 1651   Sepsis Labs: @LABRCNTIP (procalcitonin:4,lacticidven:4)  )No results found for this or any previous visit (from the past 240 hour(s)).    Radiology Studies: No results found.   Scheduled Meds: . amLODipine  10 mg Oral Daily  . aspirin  81 mg Oral  Daily  . enoxaparin (LOVENOX) injection  30 mg Subcutaneous Q24H  . hydrALAZINE  100 mg Oral TID  . sodium chloride flush  3 mL Intravenous Q12H   Continuous Infusions: . sodium chloride    . ferric gluconate (FERRLECIT/NULECIT) IV 125 mg (10/16/17 1637)     LOS: 3 days   Time Spent in minutes   30 minutes  Shanine Kreiger D.O. on 10/17/2017 at 2:41 PM  Between 7am to 7pm - Pager - [phone]  After 7pm go to [email or web address] - password [email or web address]  And look for the night coverage person covering for me after hours  [facility] Office  [phone]

## 2017-10-17 NOTE — Progress Notes (Signed)
Assessment/Plan: 1.  ESRD with improved uremic s/s.  Need to decide about OP option 2. Anemia of CKD stage 5- Getting IV iron and hold on PRBCs due to lack of symptoms and potential for transplant 3. HTN- hope to improve with HD 4. Vascular access- LUE AVF mature & working 5. Disposition- started the CLIP process   Subjective: Interval History: He spoke to the Home [facility] team.  He feels well after dialysis.  Objective: Vital signs in last 24 hours: Temp:  [98.2 F (36.8 C)-98.5 F (36.9 C)] 98.2 F (36.8 C) (12/01 0918) Pulse Rate:  [79-91] 80 (12/01 0918) Resp:  [17-20] 18 (12/01 0918) BP: (134-156)/(65-86) 147/72 (12/01 0918) SpO2:  [95 %-99 %] 99 % (12/01 0918) Weight:  [122.6 kg (270 lb 4.5 oz)-122.8 kg (270 lb 11.6 oz)] 122.6 kg (270 lb 4.5 oz) (12/01 0437) Weight change: 0.2 kg (7.1 oz)  Intake/Output from previous day: 11/30 0701 - 12/01 0700 In: 1070 [P.O.:960; IV Piggyback:110] Out: 0  Intake/Output this shift: Total I/O In: 720 [P.O.:720] Out: 1450 [Urine:1450]  Exam unchanged  Lab Results: Recent Labs    10/16/17 0514 10/17/17 0547  WBC 6.0 5.8  HGB 7.2* 7.1*  HCT 22.3* 22.1*  PLT 254 209   BMET:  Recent Labs    10/16/17 0514 10/17/17 0547  NA 139 138  K 3.5 3.4*  CL 107 104  CO2 23 24  GLUCOSE 85 92  BUN 69* 47*  CREATININE 13.57* 10.94*  CALCIUM 8.0* 8.2*   No results for input(s): PTH in the last 72 hours. Iron Studies:  Recent Labs    10/15/17 1440  IRON 28*  TIBC 168*  FERRITIN 288   Studies/Results: No results found.  Scheduled: . amLODipine  10 mg Oral Daily  . aspirin  81 mg Oral Daily  . enoxaparin (LOVENOX) injection  30 mg Subcutaneous Q24H  . hydrALAZINE  100 mg Oral TID  . sodium chloride flush  3 mL Intravenous Q12H      LOS: 3 days   Lauris PoagAlvin C Desarai Barrack 10/17/2017,3:12 PM

## 2017-10-18 LAB — BASIC METABOLIC PANEL
Anion gap: 8 (ref 5–15)
BUN: 34 mg/dL — ABNORMAL HIGH (ref 6–20)
CO2: 25 mmol/L (ref 22–32)
Calcium: 8.3 mg/dL — ABNORMAL LOW (ref 8.9–10.3)
Chloride: 106 mmol/L (ref 101–111)
Creatinine, Ser: 9.25 mg/dL — ABNORMAL HIGH (ref 0.61–1.24)
GFR calc Af Amer: 7 mL/min — ABNORMAL LOW (ref >60)
GFR calc non Af Amer: 6 mL/min — ABNORMAL LOW (ref >60)
GLUCOSE: 88 mg/dL (ref 65–99)
POTASSIUM: 3.5 mmol/L (ref 3.5–5.1)
Sodium: 139 mmol/L (ref 135–145)

## 2017-10-18 LAB — CBC
HCT: 22.7 % — ABNORMAL LOW (ref 39.0–52.0)
Hemoglobin: 7.2 g/dL — ABNORMAL LOW (ref 13.0–17.0)
MCH: 26.9 pg (ref 26.0–34.0)
MCHC: 31.7 g/dL (ref 30.0–36.0)
MCV: 84.7 fL (ref 78.0–100.0)
PLATELETS: 184 10*3/uL (ref 150–400)
RBC: 2.68 MIL/uL — AB (ref 4.22–5.81)
RDW: 14.2 % (ref 11.5–15.5)
WBC: 5.8 10*3/uL (ref 4.0–10.5)

## 2017-10-18 MED ORDER — RENA-VITE PO TABS
1.0000 | ORAL_TABLET | Freq: Every day | ORAL | Status: DC
Start: 2017-10-18 — End: 2017-10-20
  Administered 2017-10-18 – 2017-10-19 (×2): 1 via ORAL
  Filled 2017-10-18 (×3): qty 1

## 2017-10-18 MED ORDER — CALCIUM ACETATE (PHOS BINDER) 667 MG PO CAPS
667.0000 mg | ORAL_CAPSULE | Freq: Three times a day (TID) | ORAL | Status: DC
Start: 2017-10-18 — End: 2017-10-20
  Administered 2017-10-18 – 2017-10-20 (×7): 667 mg via ORAL
  Filled 2017-10-18 (×7): qty 1

## 2017-10-18 NOTE — Progress Notes (Signed)
PROGRESS NOTE    Devon SiaRedondo Sweda  ZOX:[id number] DOB: [birth year] DOA: 10/14/2017 PCP: Cheral BayHawks, Aldene N, MD   No chief complaint on file.   Brief Narrative:  HPI On 10/14/2017 by Dr. Delano Metzobert Schertz Devon [name] is a 51 y.o. male with hx of HTN and advanced CKD, has been getting ESA shots weekly and went for labs today which showed creat of 16.  His kidney doctor was called (Dr Hyman HopesWebb) was called with the labs and he recommended pt go to hospital and be evaluated for initiation of dialysis.   [facility] went [facility] and was transferred here for admission. Pt is pleasant, has been having CKD for 1-2 yrs, baseline recent creat around 9- 10 per his wife who is at bedside.  Having lots of problems with poor appetite, some wt loss (10 lbs) and nausea /vomiting which has been intermittent.  Has had some DOE lately.  His main c/o is dry mouth / thirst for last 1-2 weeks.  [facility] today showed creat 16 and Hb 7.6.   Pt has AVF in L arm placed > 1 yr ago.  He sees Dr Hyman HopesWebb every 6 wks.    Assessment & Plan   Progressing Chronic kidney disease, stage V with uremia -Baseline creatinine of approximately 10, upto 17.36 on admission -Nephrology consulted and appreciated, dialyzed on 11/29, 11/30/201 and 10/17/2017.  -diuretics held -CLIP process started -[place] [address] [place]  Anemia of chronic kidney disease -Hemoglobin 7.2 -Baseline hemoglobin in 2017 was 11 -does not appear to be actively bleeding -Anemia panel: Iron 28, Ferritin 288 -discussed with nephrology, avoid blood transfusions if possible -patient given IV iron (nuclecit) -will continue to monitor CBC  Essential Hypertension -Continue amlodipine, hydralazine -lasix/HCTZ held  Dyspnea upon exertion -Resolved, Possibly secondary to anemia vs uremia -CXR showed mild cardiomegaly and vascular congestion  DVT Prophylaxis  lovenox  Code Status: Full  Family Communication: None at  bedside  Disposition Plan: Admitted, pending CLIP- possibly discharge on 10/19/2017  Consultants Nephrology  Procedures  None  Antibiotics   Anti-infectives (From admission, onward)   None      Subjective:   Devon [name] seen and examined today.  Feeling better today. Denies current shortness of breath, chest pain, abdominal pain, N/V/D/C, headaches or dizziness.   Objective:   Vitals:   10/17/17 2214 10/18/17 0500 10/18/17 0505 10/18/17 0909  BP: (!) 168/97  137/71 124/67  Pulse: 84  82 82  Resp: 18  16 16   Temp: 98.3 F (36.8 C)  99.5 F (37.5 C) 99 F (37.2 C)  TempSrc: Oral  Oral Oral  SpO2: 98%  98% 99%  Weight: 121.4 kg (267 lb 10.2 oz) 121.4 kg (267 lb 10.2 oz)    Height:        Intake/Output Summary (Last 24 hours) at 10/18/2017 1159 Last data filed at 10/18/2017 0900 Gross per 24 hour  Intake 1440 ml  Output 950 ml  Net 490 ml   Filed Weights   10/17/17 0437 10/17/17 2214 10/18/17 0500  Weight: 122.6 kg (270 lb 4.5 oz) 121.4 kg (267 lb 10.2 oz) 121.4 kg (267 lb 10.2 oz)   Exam (no change from PE on 10/17/2017)  General: Well developed, well nourished, NAD, appears stated age  HEENT: NCAT, mucous membranes moist.   Cardiovascular: S1 S2 auscultated, RRR, no murmurs   Respiratory: Clear to auscultation bilaterally with equal chest rise  Abdomen: Soft, nontender, nondistended, + bowel sounds  Extremities: warm  dry without cyanosis clubbing or edema. LUE AVF +B  Neuro: AAOx3, nonfocal  Psych: Appropriate mood and affect, pleasant  Data Reviewed: I have personally reviewed following labs and imaging studies  CBC: Recent Labs  Lab 10/14/17 2049 10/15/17 1723 10/16/17 0514 10/17/17 0547 10/18/17 0736  WBC 7.3 6.4 6.0 5.8 5.8  HGB 7.7* 7.3* 7.2* 7.1* 7.2*  HCT 23.8* 22.6* 22.3* 22.1* 22.7*  MCV 83.5 83.7 83.5 84.4 84.7  PLT 304 275 254 209 184   Basic Metabolic Panel: Recent Labs  Lab 10/14/17 2049 10/15/17 1723 10/16/17 0514  10/17/17 0547 10/18/17 0736  NA 141 139 139 138 139  K 3.5 3.7 3.5 3.4* 3.5  CL 111 110 107 104 106  CO2 18* 18* 23 24 25   GLUCOSE 97 115* 85 92 88  BUN 97* 100* 69* 47* 34*  CREATININE 17.36* 17.22* 13.57* 10.94* 9.25*  CALCIUM 8.5* 8.2* 8.0* 8.2* 8.3*  PHOS 7.2* 7.5*  --  5.6*  --    GFR: Estimated Creatinine Clearance: 13.8 mL/min (A) (by C-G formula based on SCr of 9.25 mg/dL (H)). Liver Function Tests: Recent Labs  Lab 10/14/17 2049 10/15/17 1723 10/17/17 0547  AST 17  --   --   ALT 16* 13*  --   ALKPHOS 63  --   --   BILITOT 0.4  --   --   PROT 5.6*  --   --   ALBUMIN 2.6* 2.4* 2.2*   No results for input(s): LIPASE, AMYLASE in the last 168 hours. No results for input(s): AMMONIA in the last 168 hours. Coagulation Profile: No results for input(s): INR, PROTIME in the last 168 hours. Cardiac Enzymes: No results for input(s): CKTOTAL, CKMB, CKMBINDEX, TROPONINI in the last 168 hours. BNP (last 3 results) No results for input(s): PROBNP in the last 8760 hours. HbA1C: No results for input(s): HGBA1C in the last 72 hours. CBG: No results for input(s): GLUCAP in the last 168 hours. Lipid Profile: No results for input(s): CHOL, HDL, LDLCALC, TRIG, CHOLHDL, LDLDIRECT in the last 72 hours. Thyroid Function Tests: No results for input(s): TSH, T4TOTAL, FREET4, T3FREE, THYROIDAB in the last 72 hours. Anemia Panel: Recent Labs    10/15/17 1440  FERRITIN 288  TIBC 168*  IRON 28*   Urine analysis:    Component Value Date/Time   COLORURINE YELLOW 04/29/2016 1651   APPEARANCEUR Cloudy (A) 04/29/2016 1651   LABSPEC 1.020 04/29/2016 1651   PHURINE 6.0 04/29/2016 1651   GLUCOSEU NEGATIVE 04/29/2016 1651   HGBUR TRACE-INTACT (A) 04/29/2016 1651   BILIRUBINUR NEGATIVE 04/29/2016 1651   KETONESUR NEGATIVE 04/29/2016 1651   UROBILINOGEN 0.2 04/29/2016 1651   NITRITE NEGATIVE 04/29/2016 1651   LEUKOCYTESUR NEGATIVE 04/29/2016 1651   Sepsis  Labs: @LABRCNTIP (procalcitonin:4,lacticidven:4)  )No results found for this or any previous visit (from the past 240 hour(s)).    Radiology Studies: No results found.   Scheduled Meds: . amLODipine  10 mg Oral Daily  . aspirin  81 mg Oral Daily  . calcium acetate  667 mg Oral TID WC  . enoxaparin (LOVENOX) injection  30 mg Subcutaneous Q24H  . hydrALAZINE  100 mg Oral TID  . multivitamin  1 tablet Oral QHS  . sodium chloride flush  3 mL Intravenous Q12H   Continuous Infusions: . sodium chloride    . ferric gluconate (FERRLECIT/NULECIT) IV 125 mg (10/18/17 1059)     LOS: 4 days   Time Spent in minutes   30 minutes  [employer] Mutual [employer] OmahaMaryann Yecheskel [name]  D.O. on 10/18/2017 at 11:59 AM  Between 7am to 7pm - Pager - [phone]  After 7pm go to [email or web address] - password [email or web address]  And look for the night coverage person covering for me after hours  [facility] Office  [phone]

## 2017-10-18 NOTE — Progress Notes (Signed)
Assessment/Plan: 1.  ESRD with improved uremic s/s. HD in AM before discharge and unit assignment  2. Anemia of CKD stage 5- Getting IV iron and hold on PRBCs due to lack of symptoms and potential for transplant 3. HTN- hope to improve with HD 4. Vascular access- LUE AVF mature & working 5. Disposition- started the CLIP process, [facility] [address] [facility].  [place].  Discharge in AM  Subjective: Interval History: [facility]  Objective: Vital signs in last 24 hours: Temp:  [98.2 F (36.8 C)-99.5 F (37.5 C)] 99 F (37.2 C) (12/02 0909) Pulse Rate:  [82-87] 82 (12/02 0909) Resp:  [16-18] 16 (12/02 0909) BP: (124-176)/(67-118) 124/67 (12/02 0909) SpO2:  [98 %-99 %] 99 % (12/02 0909) Weight:  [121.4 kg (267 lb 10.2 oz)] 121.4 kg (267 lb 10.2 oz) (12/02 0500) Weight change: -1.4 kg (-1.4 oz)  Intake/Output from previous day: 12/01 0701 - 12/02 0700 In: 1440 [P.O.:1440] Out: 1800 [Urine:1800] Intake/Output this shift: Total I/O In: 240 [P.O.:240] Out: -   General appearance: alert and cooperative  Lab Results: Recent Labs    10/17/17 0547 10/18/17 0736  WBC 5.8 5.8  HGB 7.1* 7.2*  HCT 22.1* 22.7*  PLT 209 184   BMET:  Recent Labs    10/17/17 0547 10/18/17 0736  NA 138 139  K 3.4* 3.5  CL 104 106  CO2 24 25  GLUCOSE 92 88  BUN 47* 34*  CREATININE 10.94* 9.25*  CALCIUM 8.2* 8.3*   No results for input(s): PTH in the last 72 hours. Iron Studies:  Recent Labs    10/15/17 1440  IRON 28*  TIBC 168*  FERRITIN 288   Studies/Results: No results found.  Scheduled: . amLODipine  10 mg Oral Daily  . aspirin  81 mg Oral Daily  . enoxaparin (LOVENOX) injection  30 mg Subcutaneous Q24H  . hydrALAZINE  100 mg Oral TID  . sodium chloride flush  3 mL Intravenous Q12H     LOS: 4 days   Lauris PoagAlvin C Dannie Woolen 10/18/2017,10:21 AM

## 2017-10-19 LAB — BASIC METABOLIC PANEL
ANION GAP: 10 (ref 5–15)
BUN: 44 mg/dL — ABNORMAL HIGH (ref 6–20)
CALCIUM: 8.5 mg/dL — AB (ref 8.9–10.3)
CO2: 23 mmol/L (ref 22–32)
Chloride: 106 mmol/L (ref 101–111)
Creatinine, Ser: 11.39 mg/dL — ABNORMAL HIGH (ref 0.61–1.24)
GFR calc Af Amer: 5 mL/min — ABNORMAL LOW (ref >60)
GFR, EST NON AFRICAN AMERICAN: 5 mL/min — AB (ref >60)
Glucose, Bld: 88 mg/dL (ref 65–99)
POTASSIUM: 3.8 mmol/L (ref 3.5–5.1)
Sodium: 139 mmol/L (ref 135–145)

## 2017-10-19 LAB — CBC
HCT: 23.2 % — ABNORMAL LOW (ref 39.0–52.0)
HEMOGLOBIN: 7.4 g/dL — AB (ref 13.0–17.0)
MCH: 27 pg (ref 26.0–34.0)
MCHC: 31.9 g/dL (ref 30.0–36.0)
MCV: 84.7 fL (ref 78.0–100.0)
Platelets: 215 10*3/uL (ref 150–400)
RBC: 2.74 MIL/uL — AB (ref 4.22–5.81)
RDW: 14.2 % (ref 11.5–15.5)
WBC: 6.2 10*3/uL (ref 4.0–10.5)

## 2017-10-19 MED ORDER — DARBEPOETIN ALFA 100 MCG/0.5ML IJ SOSY
100.0000 ug | PREFILLED_SYRINGE | Freq: Once | INTRAMUSCULAR | Status: AC
  Administered 2017-10-19: 100 ug via INTRAVENOUS

## 2017-10-19 MED ORDER — DARBEPOETIN ALFA 100 MCG/0.5ML IJ SOSY
PREFILLED_SYRINGE | INTRAMUSCULAR | Status: AC
  Administered 2017-10-19: 100 ug via INTRAVENOUS
  Filled 2017-10-19: qty 0.5

## 2017-10-19 NOTE — Progress Notes (Signed)
PROGRESS NOTE    Devon [name]  BJY:[id number] DOB: [birth year] DOA: 10/14/2017 PCP: Cheral BayHawks, Aldene N, MD   No chief complaint on file.   Brief Narrative:  HPI On 10/14/2017 by Dr. Delano Metzobert Schertz Devon SiaRedondo Cornwall is a 51 y.o. male with hx of HTN and advanced CKD, has been getting ESA shots weekly and went for labs today which showed creat of 16.  His kidney doctor was called (Dr Hyman HopesWebb) was called with the labs and he recommended pt go to hospital and be evaluated for initiation of dialysis.   [facility] and was transferred here for admission. Pt is pleasant, has been having CKD for 1-2 yrs, baseline recent creat around 9- 10 per his wife who is at bedside.  Having lots of problems with poor appetite, some wt loss (10 lbs) and nausea /vomiting which has been intermittent.  Has had some DOE lately.  His main c/o is dry mouth / thirst for last 1-2 weeks.  [facility] today showed creat 16 and Hb 7.6.   Pt has AVF in L arm placed > 1 yr ago.  He sees Dr Hyman HopesWebb every 6 wks.    Assessment & Plan   Progressing Chronic kidney disease, stage V with uremia -Baseline creatinine of approximately 10, upto 17.36 on admission -Nephrology consulted and appreciated, dialyzed on 11/29, 11/30/201 and 10/17/2017.  -diuretics held -CLIP process started and pending -[place] [address] [place]  Anemia of chronic kidney disease -Hemoglobin 7.4 -Baseline hemoglobin in 2017 was 11 -does not appear to be actively bleeding -Anemia panel: Iron 28, Ferritin 288 -discussed with nephrology, avoid blood transfusions if possible -patient given IV iron (nuclecit) -will continue to monitor CBC  Essential Hypertension -Continue amlodipine, hydralazine -lasix/HCTZ held  Dyspnea upon exertion -Resolved, Possibly secondary to anemia vs uremia -CXR showed mild cardiomegaly and vascular congestion  DVT Prophylaxis  lovenox  Code Status: Full  Family Communication: None at  bedside  Disposition Plan: Admitted, pending CLIP- possibly discharge on 10/20/2017  Consultants Nephrology  Procedures  None  Antibiotics   Anti-infectives (From admission, onward)   None      Subjective:   Devon Siaedondo Burgner seen and examined today.  Feeling better today. Denies current shortness of breath, chest pain, abdominal pain, N/V/D/C, headaches or dizziness.   Objective:   Vitals:   10/19/17 1015 10/19/17 1047 10/19/17 1055 10/19/17 1144  BP: 116/86 120/71 127/72 (!) 145/79  Pulse: 83 82 81 85  Resp:   20 19  Temp:   98.8 F (37.1 C) 98.4 F (36.9 C)  TempSrc:   Oral Oral  SpO2:   98% 97%  Weight:   118.1 kg (260 lb 5.8 oz)   Height:        Intake/Output Summary (Last 24 hours) at 10/19/2017 1335 Last data filed at 10/19/2017 1148 Gross per 24 hour  Intake 600 ml  Output 2169 ml  Net -1569 ml   Filed Weights   10/19/17 0246 10/19/17 0700 10/19/17 1055  Weight: 121.4 kg (267 lb 10.2 oz) 120.7 kg (266 lb 1.5 oz) 118.1 kg (260 lb 5.8 oz)   Exam (no change from PE on 10/18/2017)  General: Well developed, well nourished, NAD, appears stated age  HEENT: NCAT, mucous membranes moist.   Cardiovascular: S1 S2 auscultated, RRR, no murmurs   Respiratory: Clear to auscultation bilaterally with equal chest rise  Abdomen: Soft, nontender, nondistended, + bowel sounds  Extremities: warm dry without cyanosis clubbing or edema.  LUE AVF +B  Neuro: AAOx3, nonfocal  Psych: Appropriate mood and affect, pleasant  Data Reviewed: I have personally reviewed following labs and imaging studies  CBC: Recent Labs  Lab 10/15/17 1723 10/16/17 0514 10/17/17 0547 10/18/17 0736 10/19/17 0619  WBC 6.4 6.0 5.8 5.8 6.2  HGB 7.3* 7.2* 7.1* 7.2* 7.4*  HCT 22.6* 22.3* 22.1* 22.7* 23.2*  MCV 83.7 83.5 84.4 84.7 84.7  PLT 275 254 209 184 215   Basic Metabolic Panel: Recent Labs  Lab 10/14/17 2049 10/15/17 1723 10/16/17 0514 10/17/17 0547 10/18/17 0736 10/19/17 0619   NA 141 139 139 138 139 139  K 3.5 3.7 3.5 3.4* 3.5 3.8  CL 111 110 107 104 106 106  CO2 18* 18* 23 24 25 23   GLUCOSE 97 115* 85 92 88 88  BUN 97* 100* 69* 47* 34* 44*  CREATININE 17.36* 17.22* 13.57* 10.94* 9.25* 11.39*  CALCIUM 8.5* 8.2* 8.0* 8.2* 8.3* 8.5*  PHOS 7.2* 7.5*  --  5.6*  --   --    GFR: Estimated Creatinine Clearance: 11.1 mL/min (A) (by C-G formula based on SCr of 11.39 mg/dL (H)). Liver Function Tests: Recent Labs  Lab 10/14/17 2049 10/15/17 1723 10/17/17 0547  AST 17  --   --   ALT 16* 13*  --   ALKPHOS 63  --   --   BILITOT 0.4  --   --   PROT 5.6*  --   --   ALBUMIN 2.6* 2.4* 2.2*   No results for input(s): LIPASE, AMYLASE in the last 168 hours. No results for input(s): AMMONIA in the last 168 hours. Coagulation Profile: No results for input(s): INR, PROTIME in the last 168 hours. Cardiac Enzymes: No results for input(s): CKTOTAL, CKMB, CKMBINDEX, TROPONINI in the last 168 hours. BNP (last 3 results) No results for input(s): PROBNP in the last 8760 hours. HbA1C: No results for input(s): HGBA1C in the last 72 hours. CBG: No results for input(s): GLUCAP in the last 168 hours. Lipid Profile: No results for input(s): CHOL, HDL, LDLCALC, TRIG, CHOLHDL, LDLDIRECT in the last 72 hours. Thyroid Function Tests: No results for input(s): TSH, T4TOTAL, FREET4, T3FREE, THYROIDAB in the last 72 hours. Anemia Panel: No results for input(s): VITAMINB12, FOLATE, FERRITIN, TIBC, IRON, RETICCTPCT in the last 72 hours. Urine analysis:    Component Value Date/Time   COLORURINE YELLOW 04/29/2016 1651   APPEARANCEUR Cloudy (A) 04/29/2016 1651   LABSPEC 1.020 04/29/2016 1651   PHURINE 6.0 04/29/2016 1651   GLUCOSEU NEGATIVE 04/29/2016 1651   HGBUR TRACE-INTACT (A) 04/29/2016 1651   BILIRUBINUR NEGATIVE 04/29/2016 1651   KETONESUR NEGATIVE 04/29/2016 1651   UROBILINOGEN 0.2 04/29/2016 1651   NITRITE NEGATIVE 04/29/2016 1651   LEUKOCYTESUR NEGATIVE 04/29/2016 1651    Sepsis Labs: @LABRCNTIP (procalcitonin:4,lacticidven:4)  )No results found for this or any previous visit (from the past 240 hour(s)).    Radiology Studies: No results found.   Scheduled Meds: . amLODipine  10 mg Oral Daily  . aspirin  81 mg Oral Daily  . calcium acetate  667 mg Oral TID WC  . enoxaparin (LOVENOX) injection  30 mg Subcutaneous Q24H  . hydrALAZINE  100 mg Oral TID  . multivitamin  1 tablet Oral QHS  . sodium chloride flush  3 mL Intravenous Q12H   Continuous Infusions: . sodium chloride       LOS: 5 days   Time Spent in minutes   20 minutes  Thursa Emme D.O. on 10/19/2017 at 1:35 PM  Between 7am to 7pm - Pager - [phone]  After 7pm go to [email or web address] - password [email or web address]  And look for the night coverage person covering for me after hours  [facility] Office  [phone]

## 2017-10-19 NOTE — Procedures (Signed)
Patient was seen on dialysis and the procedure was supervised.  BFR 300  Via AVF BP is  144/86.   Patient appears to be tolerating treatment well  Devon [name] A 10/19/2017

## 2017-10-19 NOTE — Progress Notes (Signed)
Assessment/Plan: 1.  ESRD with improved uremic s/s. HD now- apparently insurance/finances holding up perm placement 2. Anemia of CKD stage 5- Getting IV iron and hold on PRBCs due to lack of symptoms and potential for transplant- will also give darbe 3. HTN- hope to improve with HD- not terrible norvasc 10 dailly and hydrlazine 100 TID 4. Vascular access- LUE AVF mature & working 5. Disposition- started the CLIP process, he wants to go [facility] for [facility] treatment and potential [employer] [employer].  [place] in [place].  GIve me the day to figure out if he will be accepted 6. Bones- phos OK on low dose phoslo - no PTH this admit- no vit D- close to discharge- can be checked then   Subjective: Interval History: Seen on HD- apparently insurance issues holding up permanent OP HD placement - says he wants to do home hemo  Objective: Vital signs in last 24 hours: Temp:  [98.3 F (36.8 C)-99 F (37.2 C)] 98.5 F (36.9 C) (12/03 0700) Pulse Rate:  [78-85] 78 (12/03 0709) Resp:  [16-20] 20 (12/03 0709) BP: (124-163)/(67-86) 144/86 (12/03 0709) SpO2:  [95 %-99 %] 98 % (12/03 0700) Weight:  [120.7 kg (266 lb 1.5 oz)-121.4 kg (267 lb 10.2 oz)] 120.7 kg (266 lb 1.5 oz) (12/03 0700) Weight change: 0 kg (0 lb)  Intake/Output from previous day: 12/02 0701 - 12/03 0700 In: 960 [P.O.:960] Out: -  Intake/Output this shift: No intake/output data recorded.  Seen on HD- no c/o's Lungs - clear CV- RRR abd- soft Ext- no edema Access- left upper arm AVF- currently accessed   Lab Results: Recent Labs    10/17/17 0547 10/18/17 0736  WBC 5.8 5.8  HGB 7.1* 7.2*  HCT 22.1* 22.7*  PLT 209 184   BMET:  Recent Labs    10/17/17 0547 10/18/17 0736  NA 138 139  K 3.4* 3.5  CL 104 106  CO2 24 25  GLUCOSE 92 88  BUN 47* 34*  CREATININE 10.94* 9.25*  CALCIUM 8.2* 8.3*   No results for input(s): PTH in the last 72 hours. Iron Studies:  No results for input(s): IRON, TIBC, TRANSFERRIN,  FERRITIN in the last 72 hours. Studies/Results: No results found.  Scheduled: . amLODipine  10 mg Oral Daily  . aspirin  81 mg Oral Daily  . calcium acetate  667 mg Oral TID WC  . enoxaparin (LOVENOX) injection  30 mg Subcutaneous Q24H  . hydrALAZINE  100 mg Oral TID  . multivitamin  1 tablet Oral QHS  . sodium chloride flush  3 mL Intravenous Q12H     LOS: 5 days   Flint Hakeem A 10/19/2017,7:32 AM

## 2017-10-19 NOTE — Progress Notes (Signed)
Patient off floor in dialysis.

## 2017-10-20 DIAGNOSIS — R110 Nausea: Secondary | ICD-10-CM

## 2017-10-20 DIAGNOSIS — I10 Essential (primary) hypertension: Secondary | ICD-10-CM

## 2017-10-20 DIAGNOSIS — N19 Unspecified kidney failure: Secondary | ICD-10-CM

## 2017-10-20 DIAGNOSIS — N185 Chronic kidney disease, stage 5: Secondary | ICD-10-CM

## 2017-10-20 DIAGNOSIS — D631 Anemia in chronic kidney disease: Secondary | ICD-10-CM

## 2017-10-20 DIAGNOSIS — N183 Chronic kidney disease, stage 3 (moderate): Secondary | ICD-10-CM

## 2017-10-20 DIAGNOSIS — R682 Dry mouth, unspecified: Secondary | ICD-10-CM

## 2017-10-20 DIAGNOSIS — R0602 Shortness of breath: Secondary | ICD-10-CM

## 2017-10-20 LAB — CBC
HCT: 24.2 % — ABNORMAL LOW (ref 39.0–52.0)
HEMOGLOBIN: 7.6 g/dL — AB (ref 13.0–17.0)
MCH: 26.8 pg (ref 26.0–34.0)
MCHC: 31.4 g/dL (ref 30.0–36.0)
MCV: 85.2 fL (ref 78.0–100.0)
PLATELETS: 186 10*3/uL (ref 150–400)
RBC: 2.84 MIL/uL — AB (ref 4.22–5.81)
RDW: 14 % (ref 11.5–15.5)
WBC: 7.1 10*3/uL (ref 4.0–10.5)

## 2017-10-20 LAB — BASIC METABOLIC PANEL WITH GFR
Anion gap: 12 (ref 5–15)
BUN: 33 mg/dL — ABNORMAL HIGH (ref 6–20)
CO2: 25 mmol/L (ref 22–32)
Calcium: 8.5 mg/dL — ABNORMAL LOW (ref 8.9–10.3)
Chloride: 99 mmol/L — ABNORMAL LOW (ref 101–111)
Creatinine, Ser: 9.27 mg/dL — ABNORMAL HIGH (ref 0.61–1.24)
GFR calc Af Amer: 7 mL/min — ABNORMAL LOW
GFR calc non Af Amer: 6 mL/min — ABNORMAL LOW
Glucose, Bld: 92 mg/dL (ref 65–99)
Potassium: 3.7 mmol/L (ref 3.5–5.1)
Sodium: 136 mmol/L (ref 135–145)

## 2017-10-20 MED ORDER — RENA-VITE PO TABS: 1.0000 | ORAL_TABLET | Freq: Every day | ORAL | 0 refills | Status: DC

## 2017-10-20 MED ORDER — RENA-VITE PO TABS: 1.0000 | ORAL_TABLET | Freq: Every day | ORAL | 0 refills | Status: AC

## 2017-10-20 MED ORDER — CALCIUM ACETATE (PHOS BINDER) 667 MG PO CAPS: 667.0000 mg | ORAL_CAPSULE | Freq: Three times a day (TID) | ORAL | 0 refills | Status: DC

## 2017-10-20 NOTE — Progress Notes (Signed)
Patient Discharge: Disposition: Patient discharged to home. Education: Reviewed medications, prescriptions, follow-up appointments and discharge instructions, verbalized understanding.  IV: Discontinued IV before discharge. Telemetry: N/A Transportation: Patient refused to be escorted and walked with the wife out of the unit. Belongings: Patient took all his belongings with him.

## 2017-10-20 NOTE — Care Management Note (Signed)
Case Management Note  Patient Details  Name: Devon [name] MRN: [id number] Date of Birth: Sep 18, 1966  Subjective/Objective:     CM following for progression and d/c planning.                Action/Plan: Much confusion and some delays due to pt and significant other wishes re placement for HD. Ultimately , Devon HatchetSheila , sec os HD unit able to encourage this pt to accept outpatient HD at the center closest to his home to [facility] and [facility] time for treatments. Pt asking about training to preform HD at home. These plans and concerns will be managed outside the hospital as training and eval of pt for such will have to be completed in the outpt setting. No HH or DME needs. Pt for d/c to home with significant other.   Expected Discharge Date:  10/20/17               Expected Discharge Plan:  Home/Self Care  In-House Referral:  NA  Discharge planning Services  CM Consult  Post Acute Care Choice:  NA Choice offered to:  NA  DME Arranged:  N/A DME Agency:  NA  HH Arranged:  NA HH Agency:  NA  Status of Service:  Completed, signed off  If discussed at Long Length of Stay Meetings, dates discussed:    Additional Comments:  Starlyn SkeansRoyal, Kethan Papadopoulos U, RN 10/20/2017, 12:28 PM

## 2017-10-20 NOTE — Progress Notes (Signed)
Assessment/Plan: 1.  ESRD with improved uremic s/s. HD now- [facility]- will run for 2 hours today just to make sure can make it to Thursday 2. Anemia of CKD stage 5- Getting IV iron and hold on PRBCs due to lack of symptoms and potential for transplant- also given ESA  3. HTN-controlled on  norvasc 10 dailly and hydrlazine 100 TID 4. Vascular access- LUE AVF mature & working 5. Disposition-  [place] in [place].  Plan is for [facility] until will do home training I think in January.  All parties agreeable to plan.  He is to go to [facility] on Thursday at 11:30  6. Bones- phos OK on low dose phoslo - no PTH this admit- no vit D- close to discharge- can be checked as OP  Subjective: Interval History: No new c/o's-   Objective: Vital signs in last 24 hours: Temp:  [98.4 F (36.9 C)-99.1 F (37.3 C)] 98.5 F (36.9 C) (12/04 0957) Pulse Rate:  [81-88] 82 (12/04 0957) Resp:  [18-20] 18 (12/04 0957) BP: (127-145)/(72-81) 136/75 (12/04 0957) SpO2:  [95 %-99 %] 95 % (12/04 0957) Weight:  [118.1 kg (260 lb 5.8 oz)] 118.1 kg (260 lb 5.8 oz) (12/03 2142) Weight change: -3.3 kg (-4.4 oz)  Intake/Output from previous day: 12/03 0701 - 12/04 0700 In: 760 [P.O.:760] Out: 2469 [Urine:300] Intake/Output this shift: No intake/output data recorded.  Seen on HD- no c/o's Lungs - clear CV- RRR abd- soft Ext- no edema Access- left upper arm AVF- currently accessed   Lab Results: Recent Labs    10/19/17 0619 10/20/17 0515  WBC 6.2 7.1  HGB 7.4* 7.6*  HCT 23.2* 24.2*  PLT 215 186   BMET:  Recent Labs    10/19/17 0619 10/20/17 0515  NA 139 136  K 3.8 3.7  CL 106 99*  CO2 23 25  GLUCOSE 88 92  BUN 44* 33*  CREATININE 11.39* 9.27*  CALCIUM 8.5* 8.5*   No results for input(s): PTH in the last 72 hours. Iron Studies:  No results for input(s): IRON, TIBC, TRANSFERRIN, FERRITIN in the last 72  hours. Studies/Results: No results found.  Scheduled: . amLODipine  10 mg Oral Daily  . aspirin  81 mg Oral Daily  . calcium acetate  667 mg Oral TID WC  . enoxaparin (LOVENOX) injection  30 mg Subcutaneous Q24H  . hydrALAZINE  100 mg Oral TID  . multivitamin  1 tablet Oral QHS  . sodium chloride flush  3 mL Intravenous Q12H     LOS: 6 days   Murle Otting A 10/20/2017,10:52 AM

## 2017-10-20 NOTE — Discharge Instructions (Signed)
End-Stage Kidney Disease °End-stage kidney disease occurs when the kidneys are so damaged that they cannot do their job. The kidneys are two organs that do many important jobs in the body, which include: °· Removing wastes and extra fluids from the blood. °· Making hormones that maintain the amount of fluid in your tissues and blood vessels. °· Maintaining the right amount of fluids and chemicals in the body. ° °When the kidneys are damaged and cannot do their job, life-threatening problems occur. Without the help of the kidneys, toxins build up in the blood. In end-stage kidney disease, the kidneys cannot get better. °What are the causes? °End-stage kidney disease usually occurs when a long-lasting (chronic) kidney disease gets worse. It may also occur after the kidneys are suddenly damaged (acute kidney injury). °What increases the risk? °This condition is more likely to develop in people who are: °· Older than age 60. °· Male. °· Of African-American descent. °· Current smokers or former smokers. °· Obese. ° °You may also have an increased risk for end-stage kidney disease if you: °· Have a family history of chronic kidney disease (CKD). °· Have had kidney disease for many years. °· Have other longstanding medical conditions that affect the kidneys, such as: °? Cardiovascular disease including high blood pressure. °? Diabetes. °? Certain diseases that affect the immune system. ° °What are the signs or symptoms? °· Swelling (edema) of the face, legs, ankles, or feet. °· Numbness, tingling, or loss of feeling (sensation) in your hands or feet. °· Tiredness (lethargy). °· Nausea or vomiting. °· Confusion, trouble concentrating, or loss of consciousness. °· Chest pain. °· Shortness of breath. °· Little to no urine production. °· Muscle twitches and cramps, especially in the legs. °· Constant itchiness. °· Loss of appetite. °· Pale skin and tissue lining your eyelids (conjunctiva). °· Headaches. °· Abnormally dark or  light skin. °· Decrease in muscle size (muscle wasting). °· Easy bruising. °· Frequent hiccups. °· Stopping of menstruation in women. °· Seizures. °How is this diagnosed? °Your health care provider will measure your blood pressure and do some tests. These may include: °· Urine tests. °· Blood tests. °· Imaging tests. °· A test in which a sample of tissue is removed from the kidneys to be looked at under a microscope (kidney biopsy). ° °How is this treated? °There are two treatments for end-stage kidney disease: °· A procedure that removes toxic wastes from the body (dialysis). Depending on the type of dialysis you choose, it may be performed more than one time a day (peritoneal dialysis) or several times a week (hemodialysis). °· Surgery to receive a new kidney (kidney transplant). ° °In addition to having dialysis or a kidney transplant, you may need to take medicines: °· To control high blood pressure (hypertension). °· To control cholesterol. °· To maintain healthy electrolyte levels in your blood. ° °You may also be given a specific diet to follow that includes requirements or limits for: °· Salt (sodium). °· Protein. °· Phosphorous. °· Potassium. °· Calcium. ° °Follow these instructions at home: °· Follow your prescribed diet. °· Take over-the-counter and prescription medicines only as told by your health care provider. °? Do not take any new medicines unless approved by your health care provider. Many medicines can worsen your kidney damage. °? Do not take any vitamin and mineral supplements unless approved by your health care provider. Many nutritional supplements can worsen your kidney damage. °? The dose of some medicines that you take may need to be   adjusted. °· Do not use any tobacco products, such as cigarettes, chewing tobacco, and e-cigarettes. If you need help quitting, ask your health care provider. °· Keep all follow-up visits as told by your health care provider. This is important. °· Keep track of  your blood pressure. Report changes in your blood pressure as told by your health care provider. °· Achieve and maintain a healthy weight. If you need help with this, ask your health care provider. °· Start or continue an exercise plan. Try to exercise at least 30 minutes a day, 5 days a week. °· Stay current with immunizations as told by your health care provider. °Where to find more information: °· American Association of Kidney Patients: www.aakp.org °· National Kidney Foundation: www.kidney.org °· American Kidney Fund: www.akfinc.org °· Life Options Rehabilitation Program: www.lifeoptions.org and www.kidneyschool.org °Contact a health care provider if: °· Your symptoms get worse. °· You develop new symptoms. °Get help right away if: °· You have weakness in an arm or leg on one side of your body. °· You have difficulty speaking or you are slurring your speech. °· You have a sudden change in your vision. °· You have a sudden, severe headache. °· You have a sudden weight increase. °· You have difficulty breathing. °· Your symptoms suddenly get worse. °This information is not intended to replace advice given to you by your health care provider. Make sure you discuss any questions you have with your health care provider. °Document Released: 01/24/2004 Document Revised: 04/10/2016 Document Reviewed: 07/02/2012 °Elsevier Interactive Patient Education © 2017 Elsevier Inc. ° °

## 2017-10-20 NOTE — Discharge Summary (Signed)
Physician Discharge Summary  Ellamae SiaRedondo Jeanpaul ZOX:[id number] DOB: Oct 06, 1966 DOA: 10/14/2017  PCP: Cheral BayHawks, Aldene N, MD  Admit date: 10/14/2017 Discharge date: 10/20/2017  Time spent: 45 minutes  Recommendations for Outpatient Follow-up:  Patient will be discharged to home.  Patient will need to follow up with primary care provider within one week of discharge.  [facility]. Patient should continue medications as prescribed.  Patient should follow a renal diet with 1800mL fluid restriction per day.   Discharge Diagnoses:  Progressing Chronic kidney disease, stage V with uremia Anemia of chronic kidney disease Essential Hypertension Dyspnea upon exertion  Discharge Condition: Stable  Diet recommendation: renal diet with 1800mL fluid restriction per day  [facility]   10/19/17 0700 10/19/17 1055 10/19/17 2142  Weight: 120.7 kg (266 lb 1.5 oz) 118.1 kg (260 lb 5.8 oz) 118.1 kg (260 lb 5.8 oz)    History of present illness:  On 10/14/2017 by Dr. Harvin Hazelobert Schertz Shanta Gardneris a 51 y.o.malewith hx of HTN and advanced CKD, has been getting ESA shots weekly and went for labs today which showed creat of 16. His kidney doctor was called (Dr Hyman HopesWebb) was called with the labs and he recommended pt go to hospital and be evaluated for initiation of dialysis. [facility] and was transferred here for admission. Pt is pleasant, has been having CKD for 1-2 yrs, baseline recent creat around 9- 10 per his wife who is at bedside. Having lots of problems with poor appetite, some wt loss (10 lbs) and nausea /vomiting which has been intermittent. Has had some DOE lately. His main c/o is dry mouth / thirst for last 1-2 weeks. [facility] today showed creat 16 and Hb 7.6.  Pt has AVF in L arm placed >1 yr ago. He sees Dr Hyman HopesWebb every 6 wks.   Hospital Course:  Progressing Chronic kidney disease, stage V with uremia -Baseline  creatinine of approximately 10, upto 17.36 on admission -Nephrology consulted and appreciated, dialyzed on 11/29, 11/30/201 and 10/17/2017.  -diuretics held -Patient will dialyze today for 2 hours -next HD on 10/22/2017, [place] at 11:30am  Anemia of chronic kidney disease -Hemoglobin 7.6 -Baseline hemoglobin in 2017 was 11 -does not appear to be actively bleeding -Anemia panel: Iron 28, Ferritin 288 -discussed with nephrology, avoid blood transfusions if possible -patient given IV iron (nuclecit) -repeat CBC in one week  Essential Hypertension -Continue amlodipine, hydralazine -lasix/HCTZ held  Dyspnea upon exertion -Resolved, Possibly secondary to anemia vs uremia -CXR showed mild cardiomegaly and vascular congestion  Consultants Nephrology  Procedures  None  Discharge Exam: Vitals:   10/20/17 0616 10/20/17 0957  BP: 132/81 136/75  Pulse: 86 82  Resp: 18 18  Temp: 98.4 F (36.9 C) 98.5 F (36.9 C)  SpO2: 99% 95%   Patient feeling better today. Has no complaints today. Denies chest pain, shortness of breath, abdominal pain, N/V/D/C.    General: Well developed, well nourished, NAD, appears stated age  HEENT: NCAT, mucous membranes moist.  Cardiovascular: S1 S2 auscultated, RRR, no murmurs  Respiratory: Clear to auscultation bilaterally   Abdomen: Soft, nontender, nondistended, + bowel sounds  Extremities: warm dry without cyanosis clubbing or edema. LUE AVF +B  Neuro: AAOx3, nonfocal  Psych: Pleasant, appropriate mood and affect  Discharge Instructions Discharge Instructions    Discharge instructions   Complete by:  As directed    Patient will be discharged to home.  Patient will need to  follow up with primary care provider within one week of discharge.  [facility]. Patient should continue medications as prescribed.  Patient should follow a renal diet with 1800mL fluid restriction per day.      Allergies as of 10/20/2017   No Known Allergies     Medication List    STOP taking these medications   sodium bicarbonate 650 MG tablet     TAKE these medications   amLODipine 10 MG tablet Commonly known as:  NORVASC Take 1 tablet (10 mg total) by mouth daily.   aspirin 81 MG EC tablet TAKE 1 TABLET BY MOUTH EVERY DAY What changed:    how much to take  how to take this  when to take this   calcium acetate 667 MG capsule Commonly known as:  PHOSLO Take 1 capsule (667 mg total) by mouth 3 (three) times daily with meals.   fluticasone 50 MCG/ACT nasal spray Commonly known as:  FLONASE Place 2 sprays into both nostrils daily.   hydrALAZINE 50 MG tablet Commonly known as:  APRESOLINE Take 2 tablets (100 mg total) by mouth 3 (three) times daily.   multivitamin Tabs tablet Take 1 tablet by mouth at bedtime.      No Known Allergies    The results of significant diagnostics from this hospitalization (including imaging, microbiology, ancillary and laboratory) are listed below for reference.    Significant Diagnostic Studies: Dg Chest 2 View  Result Date: 10/14/2017 CLINICAL DATA:  Shortness of breath for 2 days, progressive. Uremia. EXAM: CHEST  2 VIEW COMPARISON:  Radiograph 01/09/2017 FINDINGS: Mild cardiomegaly. Vascular congestion with mild peribronchial cuffing. No confluent consolidation. No pleural fluid or pneumothorax. No acute osseous abnormalities. IMPRESSION: Mild cardiomegaly and vascular congestion. Mild peribronchial cuffing, possible mild pulmonary edema. Electronically Signed   By: Rubye OaksMelanie  Ehinger M.D.   On: 10/14/2017 21:38    Microbiology: No results found for this or any previous visit (from the past 240 hour(s)).   Labs: Basic Metabolic Panel: Recent Labs  Lab 10/14/17 2049 10/15/17 1723 10/16/17 0514 10/17/17 0547 10/18/17 0736 10/19/17 0619 10/20/17 0515  NA 141 139 139 138 139 139 136  K 3.5 3.7 3.5 3.4* 3.5 3.8 3.7  CL 111 110  107 104 106 106 99*  CO2 18* 18* 23 24 25 23 25   GLUCOSE 97 115* 85 92 88 88 92  BUN 97* 100* 69* 47* 34* 44* 33*  CREATININE 17.36* 17.22* 13.57* 10.94* 9.25* 11.39* 9.27*  CALCIUM 8.5* 8.2* 8.0* 8.2* 8.3* 8.5* 8.5*  PHOS 7.2* 7.5*  --  5.6*  --   --   --    Liver Function Tests: Recent Labs  Lab 10/14/17 2049 10/15/17 1723 10/17/17 0547  AST 17  --   --   ALT 16* 13*  --   ALKPHOS 63  --   --   BILITOT 0.4  --   --   PROT 5.6*  --   --   ALBUMIN 2.6* 2.4* 2.2*   No results for input(s): LIPASE, AMYLASE in the last 168 hours. No results for input(s): AMMONIA in the last 168 hours. CBC: Recent Labs  Lab 10/16/17 0514 10/17/17 0547 10/18/17 0736 10/19/17 0619 10/20/17 0515  WBC 6.0 5.8 5.8 6.2 7.1  HGB 7.2* 7.1* 7.2* 7.4* 7.6*  HCT 22.3* 22.1* 22.7* 23.2* 24.2*  MCV 83.5 84.4 84.7 84.7 85.2  PLT 254 209 184 215 186   Cardiac Enzymes: No results  for input(s): CKTOTAL, CKMB, CKMBINDEX, TROPONINI in the last 168 hours. BNP: BNP (last 3 results) No results for input(s): BNP in the last 8760 hours.  ProBNP (last 3 results) No results for input(s): PROBNP in the last 8760 hours.  CBG: No results for input(s): GLUCAP in the last 168 hours.     Signed:  Edsel Petrin  [facility] 10/20/2017, 12:17 PM

## 2018-02-22 IMAGING — US US BIOPSY
1 series · 14 of 14 positions shown · non-contrast
Comparison: Ultrasound 07/06/2016

CLINICAL DATA: Chronic renal insufficiency, glomerular nephritis.

EXAM:
ULTRASOUND GUIDED RENAL CORE BIOPSY
TECHNIQUE: Survey ultrasound was performed and an appropriate skin entry site
was localized. Site was marked, prepped with Betadine, draped in
usual sterile fashion, infiltrated locally with 1% lidocaine.
Intravenous Fentanyl and Versed were administered as conscious
sedation during continuous cardiorespiratory monitoring by the
radiology RN with a total moderate sedation time of 19 minutes.
Under real time ultrasound guidance, a 15 gauge trocar needle was
advanced to the margin of the lower pole of the right kidney for
coaxial 16 gauge core biopsy . The core samples were submitted to
pathology. The patient tolerated procedure well.
COMPLICATIONS:
None.

[Series 1: us biopsy · 0.22mm/px · 14 of 14 slices shown]
[im 1/14]
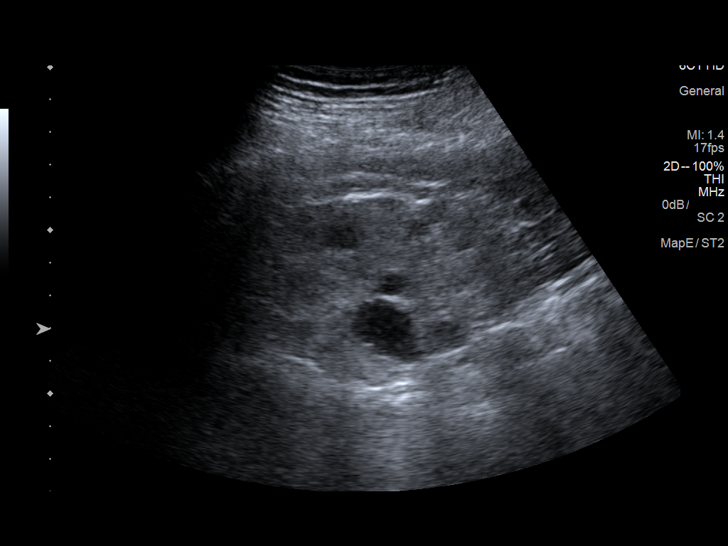
[im 2/14]
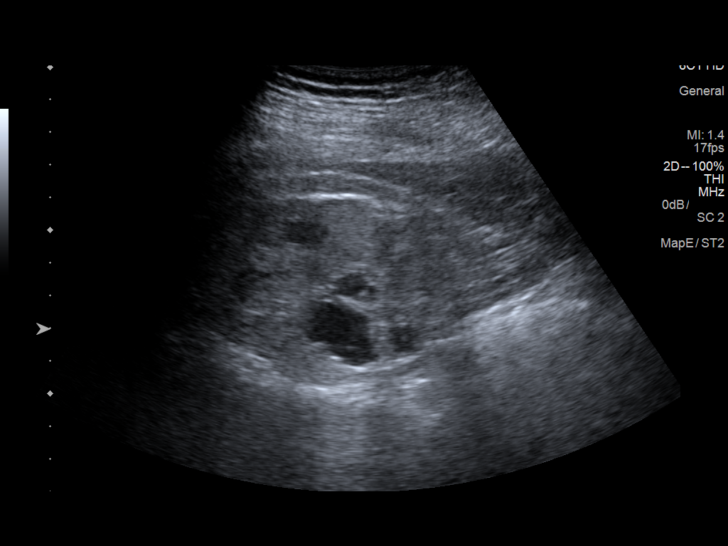
[im 3/14]
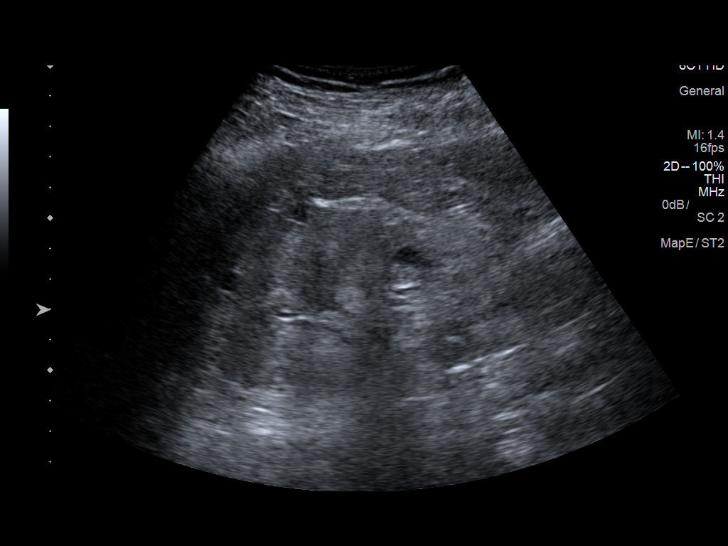
[im 4/14]
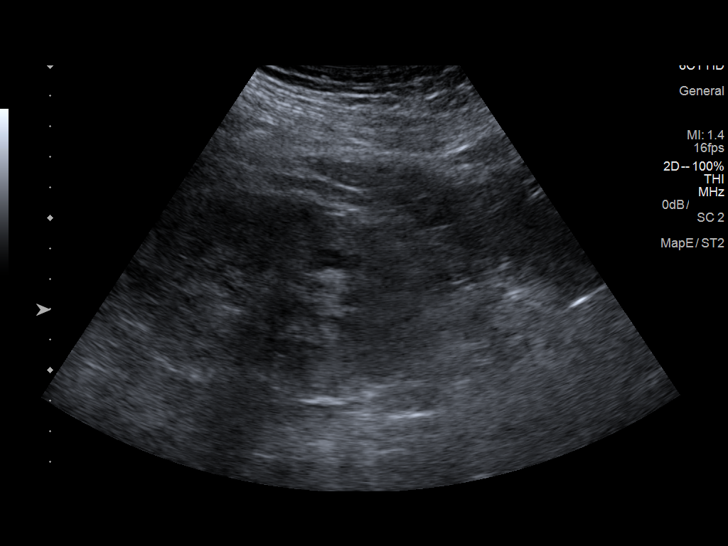
[im 5/14]
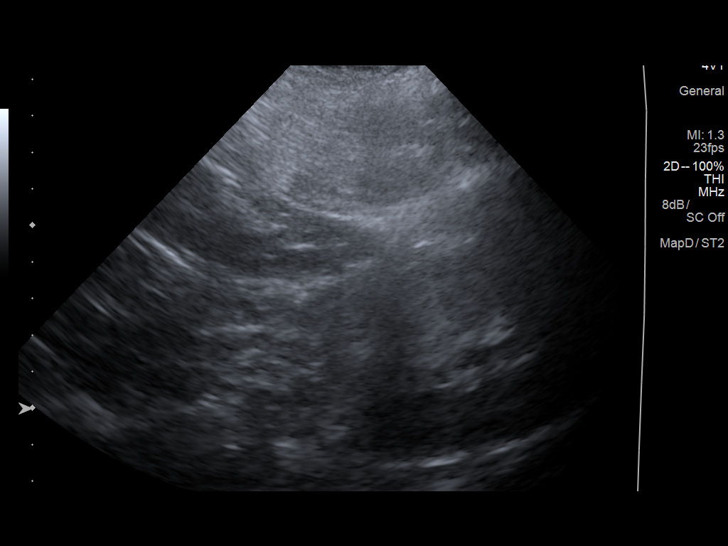
[im 6/14]
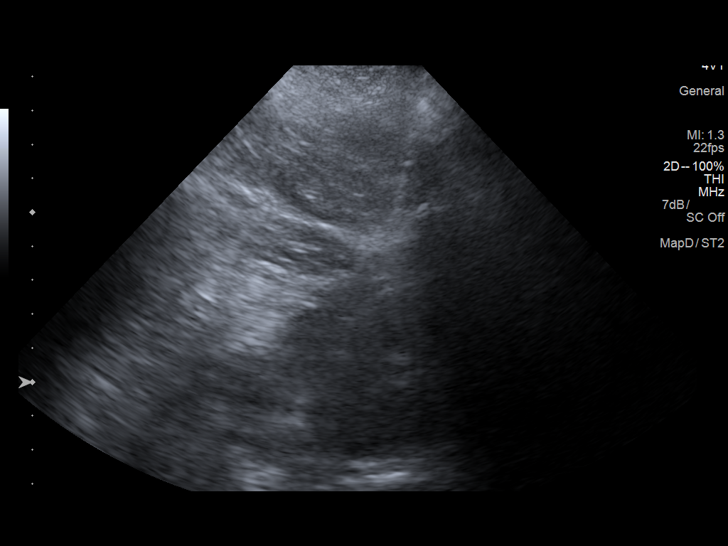
[im 7/14]
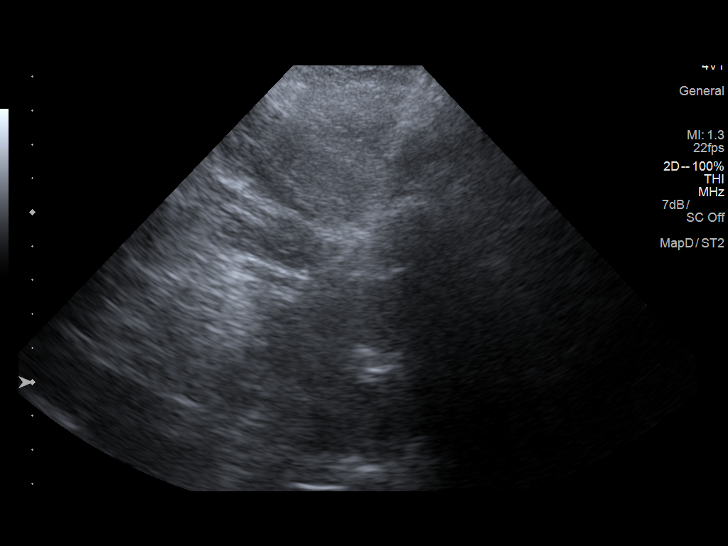
[im 8/14]
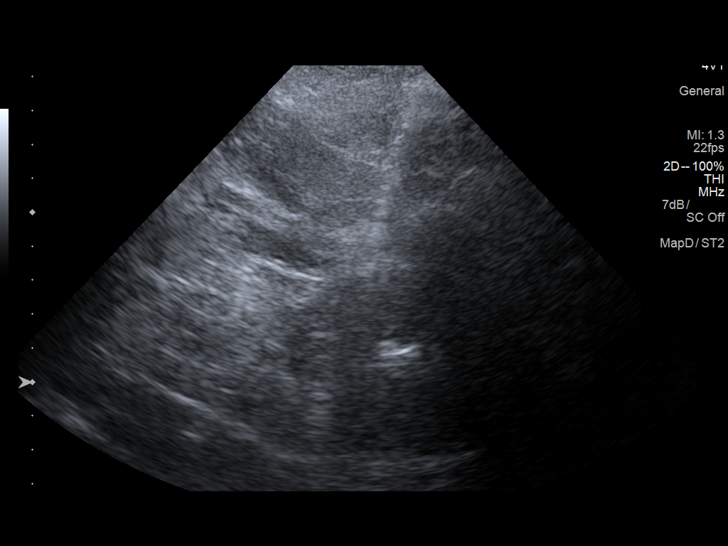
[im 9/14]
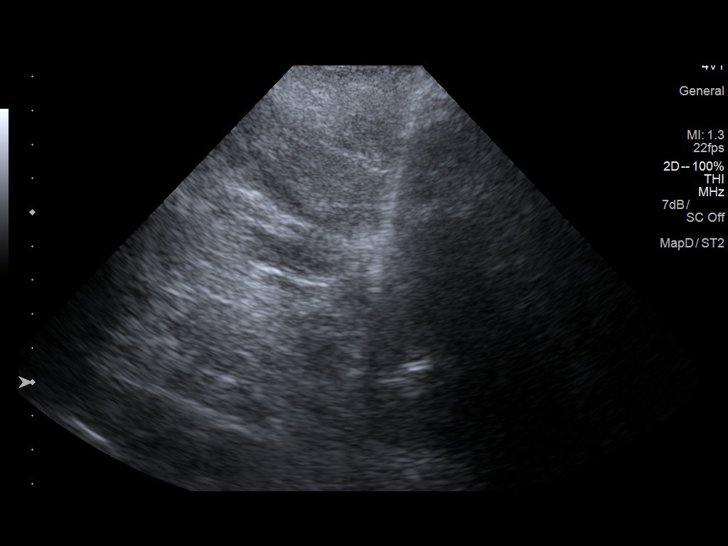
[im 10/14]
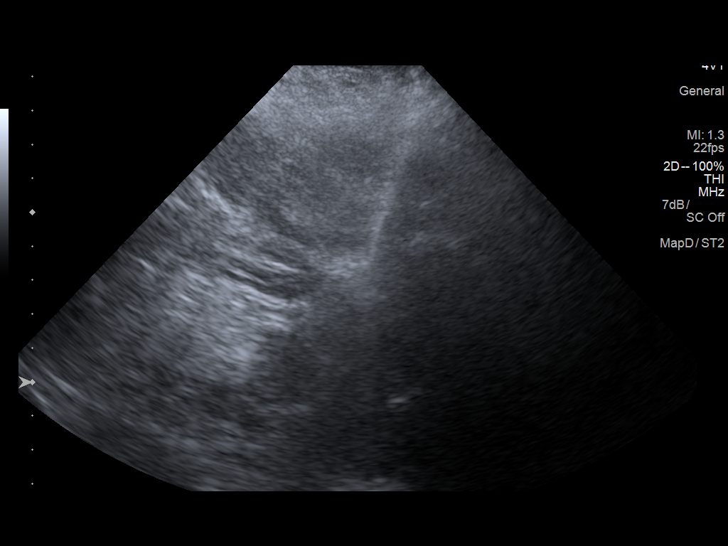
[im 11/14]
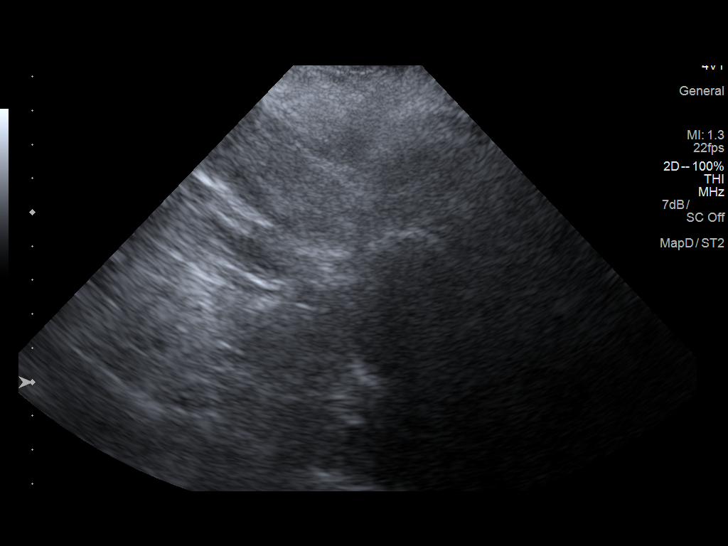
[im 12/14]
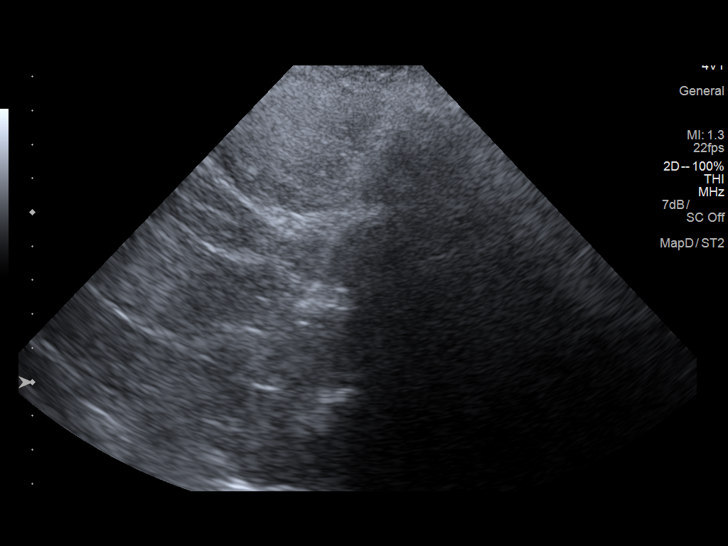
[im 13/14]
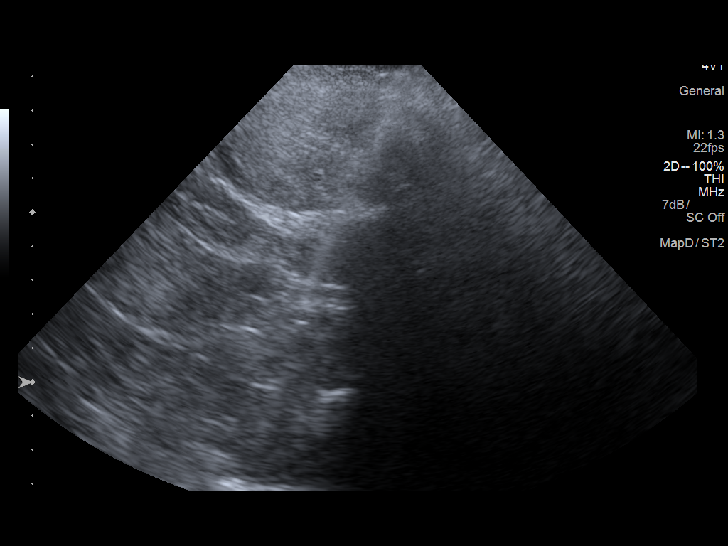
[im 14/14]
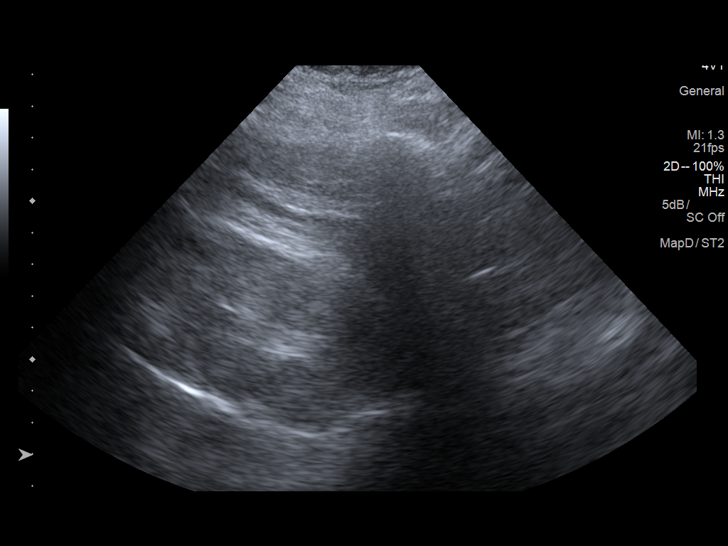

[14 of 14 positions shown; findings below may reference images not displayed]

IMPRESSION: 1. Technically successful ultrasound-guided core renal biopsy ,
lower pole right kidney.

## 2018-04-02 IMAGING — DX DG CHEST 2V
2 series · 2 of 2 positions shown · non-contrast
Comparison: None.

CLINICAL DATA: Sarcoidosis

EXAM:
CHEST  2 VIEW

[chest pa]
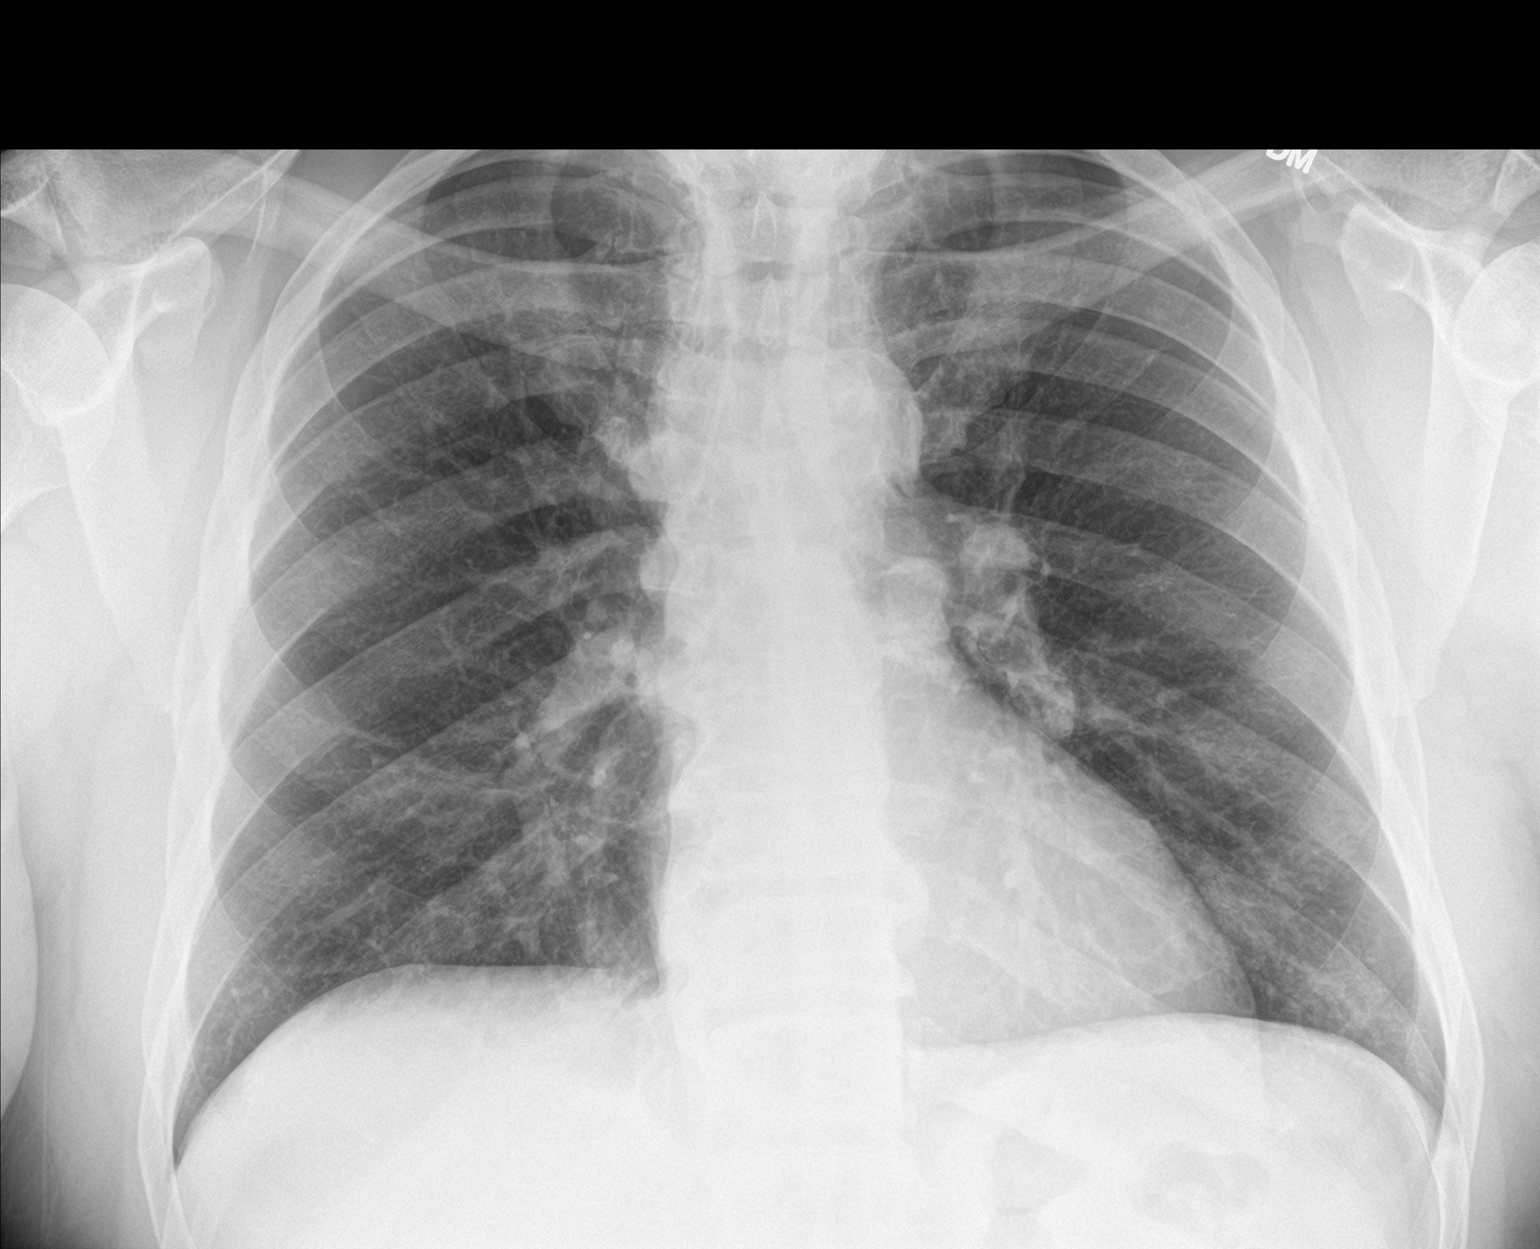

[chest lat]
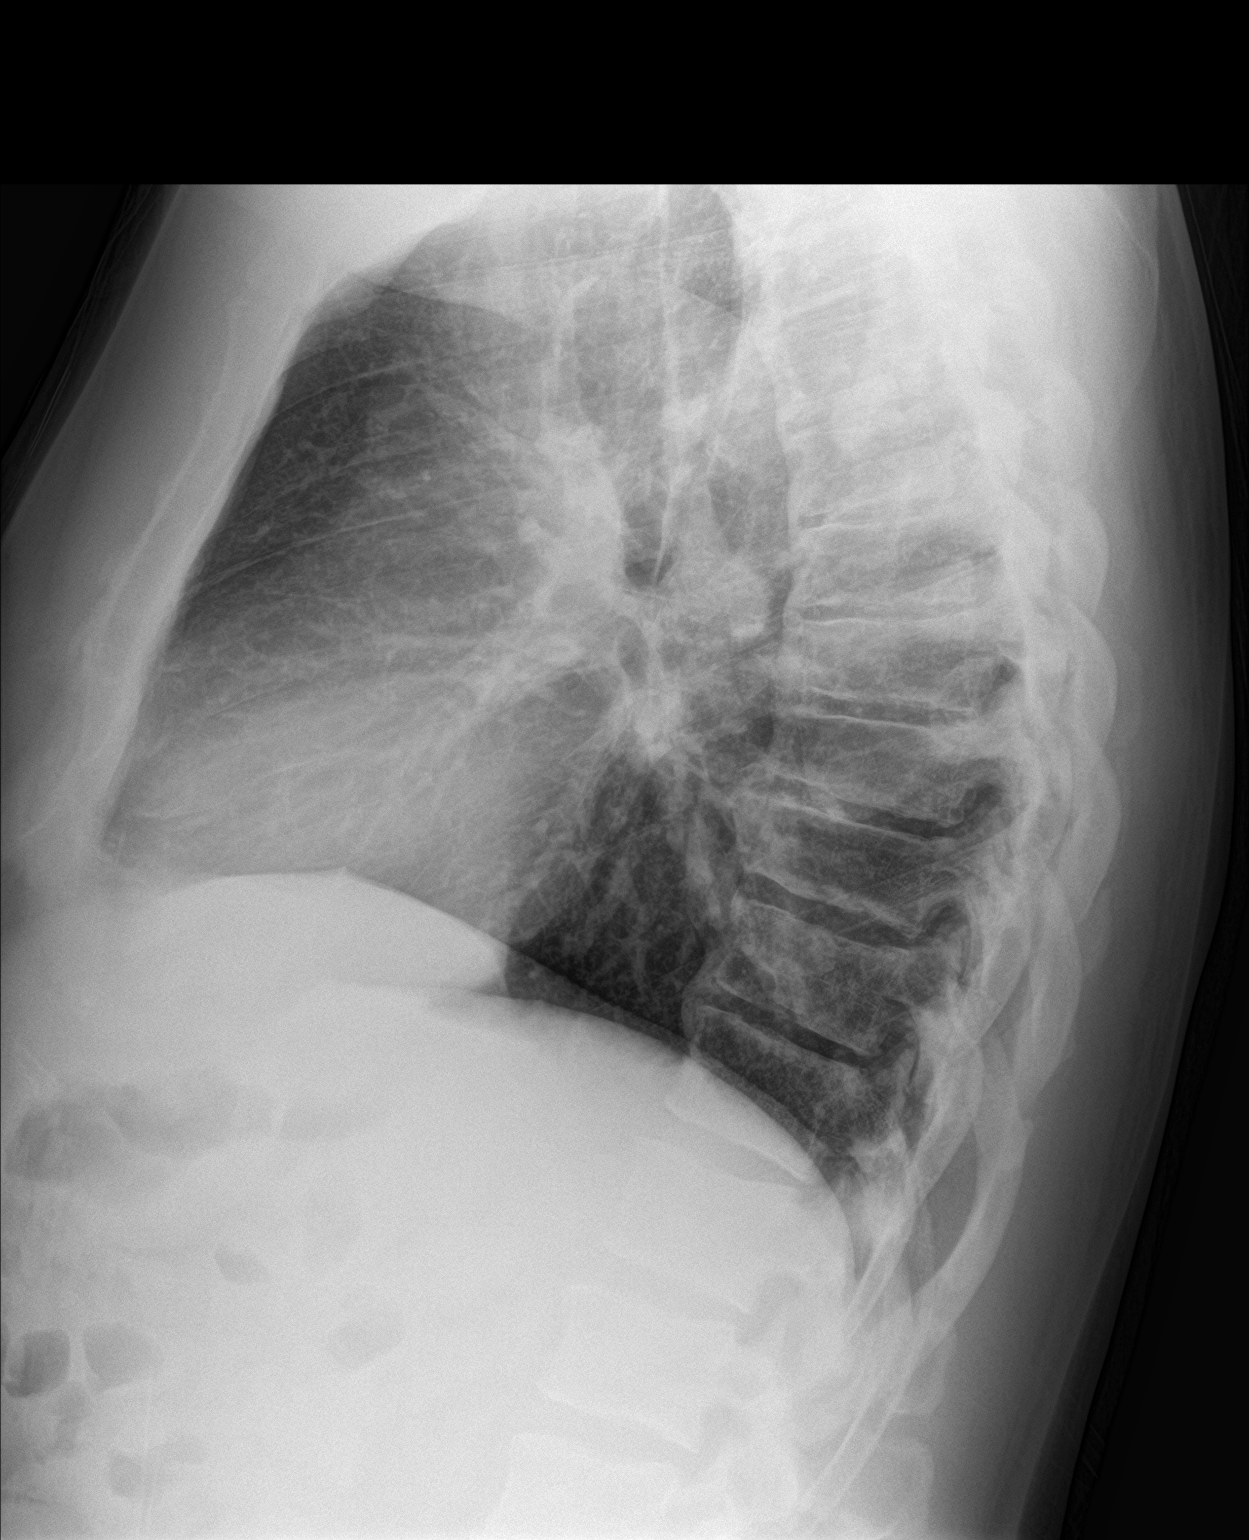

[2 of 2 positions shown; findings below may reference images not displayed]

FINDINGS: Cardiac and mediastinal contours normal. No adenopathy. Lungs are
clear without infiltrate or effusion. No significant pulmonary
scarring.
IMPRESSION: No active cardiopulmonary disease.

## 2018-06-16 ENCOUNTER — Other Ambulatory Visit: Payer: Self-pay

## 2018-06-16 DIAGNOSIS — N185 Chronic kidney disease, stage 5: Secondary | ICD-10-CM

## 2018-07-08 ENCOUNTER — Telehealth: Payer: Self-pay | Admitting: Pulmonary Disease

## 2018-07-08 NOTE — Telephone Encounter (Signed)
LMTCB

## 2018-07-09 NOTE — Telephone Encounter (Signed)
Scanned PFT printed and faxed to the number provided above. LM on named voice mail informing Joni Reiningicole that results were faxed as requested, but they were done outside our office and scanned in to patient's chart so the results might not contain everything needed.  Asked Joni Reiningicole to please call the office and let us know that she received the fax and if anything further is needed.

## 2018-07-12 ENCOUNTER — Ambulatory Visit: Payer: Self-pay | Admitting: Surgery

## 2018-07-12 ENCOUNTER — Encounter (HOSPITAL_COMMUNITY): Payer: Self-pay

## 2018-07-12 NOTE — Telephone Encounter (Signed)
Left message for Devon [name] to call back to see if anything else was needed.

## 2018-07-13 NOTE — Telephone Encounter (Signed)
Attempted to call Nicole [place] to see if anything else was needed from our office but unable to reach her. Left message for her to return call.  Due to multiple attempts trying to reach [place], per triage protocol message will be closed.

## 2018-07-23 ENCOUNTER — Encounter: Payer: Self-pay | Admitting: Podiatry

## 2018-07-23 ENCOUNTER — Ambulatory Visit (INDEPENDENT_AMBULATORY_CARE_PROVIDER_SITE_OTHER): Payer: Medicare Other | Admitting: Podiatry

## 2018-07-23 DIAGNOSIS — M79671 Pain in right foot: Secondary | ICD-10-CM | POA: Diagnosis not present

## 2018-07-23 DIAGNOSIS — M79672 Pain in left foot: Secondary | ICD-10-CM | POA: Diagnosis not present

## 2018-07-23 DIAGNOSIS — Q828 Other specified congenital malformations of skin: Secondary | ICD-10-CM

## 2018-07-23 NOTE — Progress Notes (Signed)
   Subjective:    Patient ID: Devon [name], male    DOB: [birth year], 52 y.o.   MRN: [id number]  HPI    Review of Systems  All other systems reviewed and are negative.      Objective:   Physical Exam        Assessment & Plan:

## 2018-07-23 NOTE — Patient Instructions (Signed)
Keep the bandage on for 24 hours. At that time, remove and clean with soap and water. If it hurts or burns before 24 hours go ahead and remove the bandage and wash with soap and water. Keep the area clean. If there is any blistering cover with antibiotic ointment and a bandage. Monitor for any redness, drainage, or other signs of infection. Call the office if any are to occur. If you have any questions, please call the office at [phone].  

## 2018-07-23 NOTE — Progress Notes (Signed)
Subjective:    Patient ID: Devon [name], male    DOB: October 26, 1966, 52 y.o.   MRN: [id number]  HPI 52 year old male presents the office today for concerns of skin lesions, possible warts to both of his feet.  He is seeing dermatologist every treatments of "beetle juice" as well as using over-the-counter wart medication he said no improvement continue this.  He states that despite this he continues to get the pain to the area gets a callus overlying the area.  Denies any redness or drainage or any swelling.  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  Past Medical History:  Diagnosis Date  . 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 CKD (chronic kidney disease), stage V (HCC)    "to start dialysis 10/14/2017"  . Heart murmur   . Hyperlipidemia   . Hypersomnolence 04/29/2012  . Hypertension   . OSA (obstructive sleep apnea) 04/29/2012   "have CPAP; don't use it" (10/14/2017)  . Pneumonia X 1  . Sarcoidosis    "dormant right now" (10/14/2017)  . Seasonal allergies   . 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 BASCILIC VEIN TRANSPOSITION Left 09/03/2016   Procedure: FIRST STAGE LEFT BRACHIOCEPHALIC  VEIN TRANSPOSITION;  Surgeon: Fransisco Hertz, MD;  Location: [facility] OR;  Service: Vascular;  Laterality: Left;  . BIOPSY THYROID  2017   "benign"  . EYE MUSCLE SURGERY Left    "related to sarcoidosis"  . INGUINAL HERNIA REPAIR Right   . KNEE ARTHROSCOPY Right   . LIVER BIOPSY    . LUNG BIOPSY  07/2015  . RENAL BIOPSY  07/2015     Current Outpatient Medications:  .  sucroferric oxyhydroxide (VELPHORO) 500 MG chewable tablet, CHEW & SWALLOW 3 TABLETS THREE TIMES A DAY WITH MEALS AND 2 TABLETS THREE TIMES DAILY WITH SNACKS, Disp: , Rfl:  .  aspirin 81 MG EC tablet, TAKE 1 TABLET BY MOUTH EVERY DAY (Patient taking differently: Take 81 mg by mouth every  day), Disp: 90 tablet, Rfl: 3 .  cinacalcet (SENSIPAR) 30 MG tablet, Take by mouth., Disp: , Rfl:  .  multivitamin (RENA-VIT) TABS tablet, Take 1 tablet by mouth at bedtime., Disp: 30 tablet, Rfl: 0  No Known Allergies        Objective:   Physical Exam General: AAO x3, NAD  Dermatological: Hyperkeratotic lesions present left foot submetatarsal to the right foot submetatarsal 1.  Upon debridement there is no pinpoint bleeding unable to identify any evidence of verruca this is more of a callus.  No open lesions identified.  Vascular: Dorsalis Pedis artery and Posterior Tibial artery pedal pulses are 2/4 bilateral with immedate capillary fill time. Pedal hair growth present.  There is no pain with calf compression, swelling, warmth, erythema.   Neruologic: Grossly intact via light touch bilateral.Protective threshold with Semmes Wienstein monofilament intact to all pedal sites bilateral.   Musculoskeletal: Bunion deformities present left side worse than right.  Prominent metatarsal heads plantarly with mild atrophy of the fat pad. Muscular strength 5/5 in all groups tested bilateral.  Gait: Unassisted, Nonantalgic.      Assessment & Plan:  52 year old male with hyperkeratotic lesions bilateral -Treatment options discussed including all alternatives, risks, and complications -Etiology of symptoms were discussed -I debrided the hyperkeratotic lesions to the any complications or bleeding.  I think this is more of a hyperkeratotic lesion as opposed to true viral wart.  The areas are pretty diffuse and we try to cut the mouth to be pretty large.  He did presents today wanting him cut out by Dr. that the best option at this point.  I did after debridement I did clean the area with alcohol and a pad was placed followed by salicylic acid and a bandage.  We will see him back in the next 2 to 3 weeks to see how he is doing or sooner if any issues are to arise.  Post procedure instructions were  discussed.  Vivi Barrack DPM

## 2018-08-13 ENCOUNTER — Encounter: Payer: Self-pay | Admitting: Podiatry

## 2018-08-13 ENCOUNTER — Ambulatory Visit (INDEPENDENT_AMBULATORY_CARE_PROVIDER_SITE_OTHER): Payer: Medicare Other | Admitting: Podiatry

## 2018-08-13 DIAGNOSIS — M79672 Pain in left foot: Secondary | ICD-10-CM

## 2018-08-13 DIAGNOSIS — M79671 Pain in right foot: Secondary | ICD-10-CM

## 2018-08-13 DIAGNOSIS — Q828 Other specified congenital malformations of skin: Secondary | ICD-10-CM

## 2018-08-13 NOTE — Patient Instructions (Signed)
Keep the bandage on for 24 hours. At that time, remove and clean with soap and water. If it hurts or burns before 24 hours go ahead and remove the bandage and wash with soap and water. Keep the area clean. If there is any blistering cover with antibiotic ointment and a bandage. Monitor for any redness, drainage, or other signs of infection. Call the office if any are to occur. If you have any questions, please call the office at [phone].  

## 2018-08-16 NOTE — Progress Notes (Signed)
Subjective: 52 year old male presents the office today for follow-up evaluation of skin lesions to the bottoms of his feet.  He states that overall they are actually feeling better than when I first saw him.  He states the skin came off on the right side however is not completely, from the left side.  No complications after the application of the medicine last appointment.  Denies any drainage or pus or any open sores.  Denies any swelling to his feet.  He has no other concerns.Denies any systemic complaints such as fevers, chills, nausea, vomiting. No acute changes since last appointment, and no other complaints at this time.   Objective: AAO x3, NAD DP/PT pulses palpable bilaterally, CRT less than 3 seconds Hyperkeratotic lesions present bilaterally right foot submetatarsal 1 and left foot submetatarsal 2.  Overall the area is pretty much improved.  The left foot still mildly thickened to the right side but there is decreased tenderness.  There is no open sores identified today and there is no clinical signs of infection present. No open lesions or pre-ulcerative lesions.  No pain with calf compression, swelling, warmth, erythema  Assessment: Porokeratosis bilaterally  Plan: -All treatment options discussed with the patient including all alternatives, risks, complications.  -Today I sharply debride the lesions without any complications or bleeding.  Lesions are getting better.  Clean the area with alcohol pads were placed followed by salicylic acid and a bandage.  Post procedure instructions were discussed.  Monitoring signs or symptoms of infection. -Patient encouraged to call the office with any questions, concerns, change in symptoms.   RTC 3 weeks or sooner if needed  Vivi Barrack DPM

## 2018-08-27 DIAGNOSIS — H9201 Otalgia, right ear: Secondary | ICD-10-CM | POA: Insufficient documentation

## 2018-09-03 ENCOUNTER — Ambulatory Visit (INDEPENDENT_AMBULATORY_CARE_PROVIDER_SITE_OTHER): Payer: Medicare Other | Admitting: Podiatry

## 2018-09-03 DIAGNOSIS — Q828 Other specified congenital malformations of skin: Secondary | ICD-10-CM | POA: Diagnosis not present

## 2018-09-03 DIAGNOSIS — M79672 Pain in left foot: Secondary | ICD-10-CM | POA: Diagnosis not present

## 2018-09-03 DIAGNOSIS — M79671 Pain in right foot: Secondary | ICD-10-CM

## 2018-09-04 NOTE — Progress Notes (Signed)
Subjective: 52 year old male presents the office today for follow-up evaluation of skin lesions to the bottoms of his feet.  He states that he is unsure how they are doing but states he is no longer having any pain to the lesions. He has been going barefoot some to "test" I would feel that he states that he is having no pain.  He had no complications at the last treatment of this lidocaine.  He has no other concerns today.  No swelling to his feet or redness.  He has no other concerns today. Denies any systemic complaints such as fevers, chills, nausea, vomiting. No acute changes since last appointment, and no other complaints at this time.   Objective: AAO x3, NAD DP/PT pulses palpable bilaterally, CRT less than 3 seconds Hyperkeratotic lesions present bilaterally right foot submetatarsal 1 and left foot submetatarsal 2.  Overall the lesions are much improved.  However still some mild hyperkeratotic tissue is present.  There is no underlying ulceration, drainage or any signs of infection present today. No open lesions or pre-ulcerative lesions.  No pain with calf compression, swelling, warmth, erythema  Assessment: Porokeratosis bilaterally, with improvement  Plan: -All treatment options discussed with the patient including all alternatives, risks, complications.  -Today I sharply debride the lesions without any complications or bleeding. I cleaned the areas with alcohol pads were placed followed by salicylic acid and a bandage.  Post procedure instructions were discussed.  Monitoring signs or symptoms of infection. -Patient encouraged to call the office with any questions, concerns, change in symptoms.   RTC 3 weeks or sooner if needed  Vivi Barrack DPM

## 2018-09-20 ENCOUNTER — Ambulatory Visit (INDEPENDENT_AMBULATORY_CARE_PROVIDER_SITE_OTHER): Payer: Medicare Other | Admitting: Podiatry

## 2018-09-20 DIAGNOSIS — Q828 Other specified congenital malformations of skin: Secondary | ICD-10-CM | POA: Diagnosis not present

## 2018-09-20 MED ORDER — AMMONIUM LACTATE 12 % EX CREA: TOPICAL_CREAM | CUTANEOUS | 0 refills | Status: DC | PRN

## 2018-09-22 NOTE — Progress Notes (Signed)
Subjective: 52 year old male presents the office today for follow-up evaluation of skin lesions to the bottoms of his feet.  He states overall he is doing well is not having any pain.  He states that he did not get any moisturizer he like to have a prescription sent to his pharmacy.  Otherwise he is doing well has had no swelling or redness or any drainage coming from the area he has no other concerns.   He is currently being worked up for the kidney transplant list. Objective: AAO x3, NAD DP/PT pulses palpable bilaterally, CRT less than 3 seconds Hyperkeratotic lesions present bilaterally right foot submetatarsal 1 and left foot submetatarsal 2.  Overall the lesions are much improved.   There is no underlying ulceration, drainage or any signs of infection present today.  Bunion deformities present in the left foot.  Prominence of metatarsal heads. No open lesions or pre-ulcerative lesions.  No pain with calf compression, swelling, warmth, erythema  Assessment: Porokeratosis bilaterally, with improvement  Plan: -All treatment options discussed with the patient including all alternatives, risks, complications.  -Today I sharply debride the lesions without any complications or bleeding. At this point I recommend moisturizer to the area daily.  Prescribed AmLactin cream.  Ultimately we discussed surgical intervention but unfortunately this would need to likely involve osseous work and given his current status and trying to be worked up for kidney transplant will hold off on this.  Vivi Barrack DPM

## 2018-09-24 ENCOUNTER — Ambulatory Visit: Payer: Medicare Other | Admitting: Podiatry

## 2018-11-01 ENCOUNTER — Ambulatory Visit: Payer: Medicare Other | Admitting: Podiatry

## 2018-11-05 ENCOUNTER — Ambulatory Visit (INDEPENDENT_AMBULATORY_CARE_PROVIDER_SITE_OTHER): Payer: Medicare Other | Admitting: Podiatry

## 2018-11-05 ENCOUNTER — Encounter: Payer: Self-pay | Admitting: Podiatry

## 2018-11-05 DIAGNOSIS — Q828 Other specified congenital malformations of skin: Secondary | ICD-10-CM

## 2018-11-05 DIAGNOSIS — M21619 Bunion of unspecified foot: Secondary | ICD-10-CM | POA: Diagnosis not present

## 2018-11-05 DIAGNOSIS — M2042 Other hammer toe(s) (acquired), left foot: Secondary | ICD-10-CM

## 2018-11-05 DIAGNOSIS — M216X2 Other acquired deformities of left foot: Secondary | ICD-10-CM

## 2018-11-05 MED ORDER — AMMONIUM LACTATE 12 % EX CREA: TOPICAL_CREAM | CUTANEOUS | 0 refills | Status: AC | PRN

## 2018-11-07 NOTE — Progress Notes (Signed)
Subjective: 52 year old male presents the office today for concerns of skin lesions to the bottoms of both of his feet with the left side worse than right.  Left side needs be trimmed today it is causing discomfort.  Denies any drainage or pus.  He is interested in having surgery.  He is ambulating on the transplant waiting list and is in a contact this doctor to see if he can have surgery on his foot. Denies any systemic complaints such as fevers, chills, nausea, vomiting. No acute changes since last appointment, and no other complaints at this time.   Objective: AAO x3, NAD DP/PT pulses palpable bilaterally, CRT less than 3 seconds Hyperkeratotic lesion left foot submetatarsal 2.  Upon debridement there is no underlying ulceration, drainage clinical signs of infection noted today.  Moderate bunion deformities present as well as prominence of metatarsal head plantarly with hammertoe deformity.  Minimal hyperkeratotic tissue right foot submetatarsal 1.  No open lesions or pre-ulcerative lesions.  No pain with calf compression, swelling, warmth, erythema  Assessment: Hyperkeratotic lesion left foot submetatarsal 2  Plan: -All treatment options discussed with the patient including all alternatives, risks, complications.  -Subedi to the hyperkeratotic lesions without any complications or bleeding. -Patient states he is interested in having surgery for this.  He is going to try to get clearance from his doctors in regards to that the upcoming transplant waiting list.  I discussed with him the surgery to be more involved than just "cutting it out".  We discussed this due to a biomechanical issue.  He declined x-rays today but if he consider surgery we will get the x-rays. -Patient encouraged to call the office with any questions, concerns, change in symptoms.   Devon [name] DPM
